# Patient Record
Sex: Female | Born: 1980 | Race: Black or African American | Hispanic: No | Marital: Married | Smoking: Never smoker
Health system: Southern US, Community
[De-identification: ages and names within clinical notes are randomized; demographics above are authoritative.]

## PROBLEM LIST (undated history)

## (undated) DIAGNOSIS — N760 Acute vaginitis: Secondary | ICD-10-CM

## (undated) DIAGNOSIS — J45909 Unspecified asthma, uncomplicated: Secondary | ICD-10-CM

## (undated) DIAGNOSIS — L680 Hirsutism: Secondary | ICD-10-CM

## (undated) DIAGNOSIS — B9689 Other specified bacterial agents as the cause of diseases classified elsewhere: Secondary | ICD-10-CM

## (undated) DIAGNOSIS — E282 Polycystic ovarian syndrome: Secondary | ICD-10-CM

## (undated) DIAGNOSIS — O24419 Gestational diabetes mellitus in pregnancy, unspecified control: Secondary | ICD-10-CM

## (undated) HISTORY — DX: Other specified bacterial agents as the cause of diseases classified elsewhere: N76.0

## (undated) HISTORY — DX: Other specified bacterial agents as the cause of diseases classified elsewhere: B96.89

## (undated) HISTORY — DX: Polycystic ovarian syndrome: E28.2

## (undated) HISTORY — DX: Unspecified asthma, uncomplicated: J45.909

## (undated) HISTORY — DX: Gestational diabetes mellitus in pregnancy, unspecified control: O24.419

## (undated) HISTORY — DX: Hirsutism: L68.0

---

## 2007-01-17 ENCOUNTER — Emergency Department (HOSPITAL_COMMUNITY): Admission: EM | Admit: 2007-01-17 | Discharge: 2007-01-17 | Payer: Self-pay | Admitting: Emergency Medicine

## 2008-02-21 ENCOUNTER — Emergency Department (HOSPITAL_COMMUNITY): Admission: EM | Admit: 2008-02-21 | Discharge: 2008-02-21 | Payer: Self-pay | Admitting: Emergency Medicine

## 2008-05-28 ENCOUNTER — Inpatient Hospital Stay (HOSPITAL_COMMUNITY): Admission: AD | Admit: 2008-05-28 | Discharge: 2008-05-28 | Payer: Self-pay | Admitting: Obstetrics & Gynecology

## 2008-05-28 ENCOUNTER — Ambulatory Visit: Payer: Self-pay | Admitting: Advanced Practice Midwife

## 2009-04-13 ENCOUNTER — Emergency Department (HOSPITAL_COMMUNITY): Admission: EM | Admit: 2009-04-13 | Discharge: 2009-04-13 | Payer: Self-pay | Admitting: Family Medicine

## 2010-04-15 LAB — POCT PREGNANCY, URINE: Preg Test, Ur: NEGATIVE

## 2010-04-15 LAB — POCT I-STAT, CHEM 8
BUN: 13 mg/dL (ref 6–23)
Calcium, Ion: 1.29 mmol/L (ref 1.12–1.32)
Chloride: 107 mEq/L (ref 96–112)
Creatinine, Ser: 0.8 mg/dL (ref 0.4–1.2)
Glucose, Bld: 85 mg/dL (ref 70–99)
HCT: 37 % (ref 36.0–46.0)
Hemoglobin: 12.6 g/dL (ref 12.0–15.0)
Potassium: 4 mEq/L (ref 3.5–5.1)
Sodium: 140 mEq/L (ref 135–145)
TCO2: 26 mmol/L (ref 0–100)

## 2010-04-15 LAB — POCT URINALYSIS DIP (DEVICE)
Bilirubin Urine: NEGATIVE
Glucose, UA: NEGATIVE mg/dL
Ketones, ur: NEGATIVE mg/dL
Nitrite: NEGATIVE
Protein, ur: NEGATIVE mg/dL
Specific Gravity, Urine: 1.02 (ref 1.005–1.030)
Urobilinogen, UA: 0.2 mg/dL (ref 0.0–1.0)
pH: 5.5 (ref 5.0–8.0)

## 2010-04-30 LAB — URINALYSIS, ROUTINE W REFLEX MICROSCOPIC
Bilirubin Urine: NEGATIVE
Glucose, UA: NEGATIVE mg/dL
Hgb urine dipstick: NEGATIVE
Ketones, ur: NEGATIVE mg/dL
Nitrite: NEGATIVE
Protein, ur: NEGATIVE mg/dL
Specific Gravity, Urine: 1.01 (ref 1.005–1.030)
Urobilinogen, UA: 0.2 mg/dL (ref 0.0–1.0)
pH: 7 (ref 5.0–8.0)

## 2010-04-30 LAB — CBC
HCT: 32.9 % — ABNORMAL LOW (ref 36.0–46.0)
Hemoglobin: 10.9 g/dL — ABNORMAL LOW (ref 12.0–15.0)
MCHC: 33.1 g/dL (ref 30.0–36.0)
MCV: 76.7 fL — ABNORMAL LOW (ref 78.0–100.0)
Platelets: 352 10*3/uL (ref 150–400)
RBC: 4.29 MIL/uL (ref 3.87–5.11)
RDW: 16 % — ABNORMAL HIGH (ref 11.5–15.5)
WBC: 5.8 10*3/uL (ref 4.0–10.5)

## 2010-04-30 LAB — URINE CULTURE: Colony Count: 4000

## 2010-04-30 LAB — GC/CHLAMYDIA PROBE AMP, GENITAL
Chlamydia, DNA Probe: NEGATIVE
GC Probe Amp, Genital: NEGATIVE

## 2010-04-30 LAB — WET PREP, GENITAL
Trich, Wet Prep: NONE SEEN
Yeast Wet Prep HPF POC: NONE SEEN

## 2012-02-10 ENCOUNTER — Encounter (HOSPITAL_COMMUNITY): Payer: Self-pay

## 2012-02-10 ENCOUNTER — Emergency Department (INDEPENDENT_AMBULATORY_CARE_PROVIDER_SITE_OTHER): Payer: BC Managed Care – PPO

## 2012-02-10 ENCOUNTER — Emergency Department (INDEPENDENT_AMBULATORY_CARE_PROVIDER_SITE_OTHER)
Admission: EM | Admit: 2012-02-10 | Discharge: 2012-02-10 | Disposition: A | Discharge status: Home / Self Care | Payer: BC Managed Care – PPO | Source: Home / Self Care | Attending: Emergency Medicine | Admitting: Emergency Medicine

## 2012-02-10 DIAGNOSIS — S82899A Other fracture of unspecified lower leg, initial encounter for closed fracture: Secondary | ICD-10-CM

## 2012-02-10 MED ORDER — HYDROCODONE-ACETAMINOPHEN 5-325 MG PO TABS: ORAL_TABLET | ORAL | Status: DC

## 2012-02-10 NOTE — ED Notes (Signed)
States she missed the last 3 steps when taking out trash, twisting right ankle ; c/o pain right ankle, worse when attempting weight bearing , ROM, or w palpation

## 2012-02-10 NOTE — ED Provider Notes (Signed)
Chief Complaint  Patient presents with  . Ankle Pain    History of Present Illness:   Donna Morrison is a 32 year old female who injured her right ankle, coming down some steps last night. She missed a step and fell. She twisted her right ankle. Did not hear a pop. Ever since then she's had pain and swelling beneath the lateral malleolus of the ankle and she's been unable to bear weight. She denies any numbness or tingling.  Review of Systems:  Other than noted above, the patient denies any of the following symptoms: Systemic:  No fevers, chills, sweats, or aches.   Musculoskeletal:  No joint pain, arthritis, bursitis, swelling, back pain, or neck pain. Neurological:  No muscular weakness or paresthesias.  PMFSH:  Past medical history, family history, social history, meds, and allergies were reviewed.  Physical Exam:   Vital signs:  BP 112/74  Pulse 74  Temp 98.2 F (36.8 C) (Oral)  Resp 16  Ht 5\' 4"  (1.626 m)  Wt 160 lb (72.576 kg)  BMI 27.46 kg/m2  SpO2 100%  LMP 01/14/2012 Gen:  Alert and oriented times 3.  In no distress. Musculoskeletal: Exam of the ankle reveals there is pain to palpation below the lateral malleolus, she has slight swelling, no bruising or deformity. Anterior drawer sign was negative.  Talar tilt was negative. Squeeze test was negative. Achilles tendon, peroneal tendon, and tibialis posterior were intact. Otherwise, all joints had a full a ROM with no swelling, bruising or deformity.  No edema, pulses full. Extremities were warm and pink.  Capillary refill was brisk.  Skin:  Clear, warm and dry.  No rash. Neuro:  Alert and oriented times 3.  Muscle strength was normal.  Sensation was intact to light touch.   Radiology:  Dg Ankle Complete Right  02/10/2012  *RADIOLOGY REPORT*  Clinical Data: Right ankle injury and pain.  RIGHT ANKLE - COMPLETE 3+ VIEW  Comparison: None  Findings: A tiny density along the lateral aspect of the calcaneus may represent a small  avulsion fracture. No other fracture, subluxation or dislocation identified. The ankle mortise is intact. No focal bony lesions are present.  IMPRESSION: Possible tiny avulsion fracture off of the calcaneal lateral process.   Original Report Authenticated By: Harmon Pier, M.D.    I reviewed the images independently and personally and concur with the radiologist's findings.  Course in Urgent Care Center:   She was given a Cam Walker boot and crutches for ambulation.  Assessment:  The encounter diagnosis was Ankle fracture.  Plan:   1.  The following meds were prescribed:   New Prescriptions   HYDROCODONE-ACETAMINOPHEN (NORCO/VICODIN) 5-325 MG PER TABLET    1 to 2 tabs every 4 to 6 hours as needed for pain.   2.  The patient was instructed in symptomatic care, including rest and activity, elevation, application of ice and compression.  Appropriate handouts were given. 3.  The patient was told to return if becoming worse in any way, if no better in 3 or 4 days, and given some red flag symptoms that would indicate earlier return.   4.  The patient was told to follow up with Dr. Jodi Geralds in one to 2 weeks.    Reuben Likes, MD 02/10/12 [name]

## 2012-10-15 ENCOUNTER — Encounter (HOSPITAL_COMMUNITY): Payer: Self-pay | Admitting: Emergency Medicine

## 2012-10-15 ENCOUNTER — Emergency Department (INDEPENDENT_AMBULATORY_CARE_PROVIDER_SITE_OTHER)
Admission: EM | Admit: 2012-10-15 | Discharge: 2012-10-15 | Disposition: A | Discharge status: Home / Self Care | Payer: Self-pay | Source: Home / Self Care | Attending: Emergency Medicine | Admitting: Emergency Medicine

## 2012-10-15 DIAGNOSIS — R1310 Dysphagia, unspecified: Secondary | ICD-10-CM

## 2012-10-15 DIAGNOSIS — R002 Palpitations: Secondary | ICD-10-CM

## 2012-10-15 NOTE — ED Provider Notes (Signed)
CSN: [id number]     Arrival date & time 10/15/12  1859 History   First MD Initiated Contact with Patient 10/15/12 2011     Chief Complaint  Patient presents with  . Chest Pain   (Consider location/radiation/quality/duration/timing/severity/associated sxs/prior Treatment) HPI Comments: Pt presents complaining of intermittent palpitations over the past 2 weeks, as well as a feeling of food getting stuck in her esophagus in her mid chest after eating that started 3 weeks ago and resolved completely 2 weeks ago. The palpitations only happened a couple of times, and resolved quickly. These happened after she woke up from a scary dream. She did not have any associated symptoms while she was having the palpitations including no dizziness, lightheadedness, chest pain, nausea, diaphoresis. The feeling of food getting stuck in her esophagus happened frequently for one week but then resolved completely. She has not experienced this at all in the past 2 weeks.   Patient is a 32 y.o. female presenting with chest pain.  Chest Pain Associated symptoms: dysphagia and palpitations   Associated symptoms: no abdominal pain, no cough, no dizziness, no fever, no headache, no nausea, no shortness of breath, not vomiting and no weakness     History reviewed. No pertinent past medical history. History reviewed. No pertinent past surgical history. No family history on file. History  Substance Use Topics  . Smoking status: Never Smoker   . Smokeless tobacco: Not on file  . Alcohol Use: No   OB History   Grav Para Term Preterm Abortions TAB SAB Ect Mult Living                 Review of Systems  Constitutional: Negative for fever and chills.  HENT: Positive for trouble swallowing.   Eyes: Negative for visual disturbance.  Respiratory: Negative for cough and shortness of breath.   Cardiovascular: Positive for palpitations. Negative for chest pain and leg swelling.  Gastrointestinal: Negative for nausea,  vomiting and abdominal pain.  Endocrine: Negative for polydipsia and polyuria.  Genitourinary: Negative for dysuria, urgency and frequency.  Musculoskeletal: Negative for myalgias and arthralgias.  Skin: Negative for rash.  Neurological: Negative for dizziness, weakness, light-headedness and headaches.    Allergies  Review of patient's allergies indicates no known allergies.  Home Medications   Current Outpatient Rx  Name  Route  Sig  Dispense  Refill  . HYDROcodone-acetaminophen (NORCO/VICODIN) 5-325 MG per tablet      1 to 2 tabs every 4 to 6 hours as needed for pain.   20 tablet   0    BP 119/79  Pulse 68  Temp(Src) 98.2 F (36.8 C) (Oral)  Resp 14  SpO2 100%  LMP 09/30/2012 Physical Exam  Nursing note and vitals reviewed. Constitutional: She is oriented to person, place, and time. Vital signs are normal. She appears well-developed and well-nourished. No distress.  HENT:  Head: Normocephalic and atraumatic.  Cardiovascular: Normal rate, regular rhythm and normal heart sounds.  Exam reveals no gallop and no friction rub.   No murmur heard. Pulmonary/Chest: Effort normal and breath sounds normal. No respiratory distress. She has no wheezes. She has no rales.  Abdominal: Soft. There is no tenderness.  Neurological: She is alert and oriented to person, place, and time. She has normal strength. Coordination normal.  Skin: Skin is warm and dry. No rash noted. She is not diaphoretic.  Psychiatric: She has a normal mood and affect. Judgment normal.    ED Course  Procedures (including critical care time)  Labs Review Labs Reviewed - No data to display Imaging Review No results found.  MDM   1. Heart palpitations   2. Dysphagia    Physical exam is normal. EKG is normal. The palpitations were likely caused by stress, she will followup with her primary care if any of these symptoms return.  ER if persistent symptoms     Graylon Good, PA-C 10/16/12 [phone]

## 2012-10-15 NOTE — ED Notes (Signed)
Onset three weeks ago of episodes of feeling like food was lodging in esophagus when she would swallow.  Patient also relates feeling tightness in chest with these episodes.  Patient describes a "thumping" sensation in chest as well.  Currently is not having any pain.  Patient is talkative, laughing, frequently tapping on phone

## 2012-10-16 NOTE — ED Provider Notes (Signed)
Medical screening examination/treatment/procedure(s) were performed by non-physician practitioner and as supervising physician I was immediately available for consultation/collaboration.  Leslee Home, M.D.   Reuben Likes, MD 10/16/12 1044

## 2013-12-06 ENCOUNTER — Encounter (HOSPITAL_COMMUNITY): Payer: Self-pay | Admitting: *Deleted

## 2013-12-06 ENCOUNTER — Emergency Department (HOSPITAL_COMMUNITY)
Admission: EM | Admit: 2013-12-06 | Discharge: 2013-12-06 | Disposition: A | Discharge status: Home / Self Care | Payer: BC Managed Care – PPO | Attending: Emergency Medicine | Admitting: Emergency Medicine

## 2013-12-06 DIAGNOSIS — Z791 Long term (current) use of non-steroidal anti-inflammatories (NSAID): Secondary | ICD-10-CM | POA: Insufficient documentation

## 2013-12-06 DIAGNOSIS — M25461 Effusion, right knee: Secondary | ICD-10-CM | POA: Diagnosis not present

## 2013-12-06 DIAGNOSIS — G5602 Carpal tunnel syndrome, left upper limb: Secondary | ICD-10-CM | POA: Diagnosis not present

## 2013-12-06 DIAGNOSIS — R200 Anesthesia of skin: Secondary | ICD-10-CM | POA: Insufficient documentation

## 2013-12-06 DIAGNOSIS — M5412 Radiculopathy, cervical region: Secondary | ICD-10-CM

## 2013-12-06 DIAGNOSIS — M79602 Pain in left arm: Secondary | ICD-10-CM | POA: Diagnosis present

## 2013-12-06 DIAGNOSIS — Z79899 Other long term (current) drug therapy: Secondary | ICD-10-CM | POA: Diagnosis not present

## 2013-12-06 MED ORDER — NAPROXEN 250 MG PO TABS
500.0000 mg | ORAL_TABLET | Freq: Once | ORAL | Status: AC
  Administered 2013-12-06: 500 mg via ORAL
  Filled 2013-12-06: qty 2

## 2013-12-06 MED ORDER — METHOCARBAMOL 500 MG PO TABS: 500.0000 mg | ORAL_TABLET | Freq: Two times a day (BID) | ORAL | Status: DC

## 2013-12-06 MED ORDER — NAPROXEN 500 MG PO TABS: 500.0000 mg | ORAL_TABLET | Freq: Two times a day (BID) | ORAL | Status: DC

## 2013-12-06 NOTE — ED Notes (Addendum)
Left hand tingling -  2 hrs. Later - posterior neck pain that radiated down left hand. Chronic rt. Knee pain and swollen. Pt. States, "same issue in [place]; told that she had a stroke and never admitted; got a pain shot and sent home."

## 2013-12-06 NOTE — ED Notes (Signed)
Declined W/C at D/C and was escorted to lobby by RN. 

## 2013-12-06 NOTE — ED Provider Notes (Signed)
CSN: [id number]     Arrival date & time 12/06/13  1257 History  This chart was scribed for non-physician practitioner, Dierdre Forth, PA-C working with Donna Lennert, MD by Greggory Stallion, ED scribe. This patient was seen in room TR10C/TR10C and the patient's care was started at 3:02 PM.   Chief Complaint  Patient presents with  . Shoulder Pain   The history is provided by the patient. No language interpreter was used.    HPI Comments: Donna Morrison is a 33 y.o. female who presents to the Emergency Department complaining of resolved left arm and hand numbness and tingling that started this morning. States it stopped on the way here. Reports left arm pain and left sided neck pain that started once the tingling stopped but is now resolved. Pt states this has happened in the past but it was worse than before. The symptoms are worse in the mornings and states she will wake up in the middle of the night with the tingling in her left arm and hand. Pt has to type and use the phone a lot at work and often this creates the tingling in her hand.  She denies weakness in the LUE. She is also complaining of right knee pain with associated swelling. States she does not work out like she used to. NO hx of trauma to the knee.  Pt has iced with no relief. She does not have a PCP.   History reviewed. No pertinent past medical history. History reviewed. No pertinent past surgical history. History reviewed. No pertinent family history. History  Substance Use Topics  . Smoking status: Never Smoker   . Smokeless tobacco: Not on file  . Alcohol Use: No   OB History    No data available     Review of Systems  Constitutional: Negative for fever, chills and fatigue.  Respiratory: Negative for chest tightness and shortness of breath.   Cardiovascular: Negative for chest pain.  Gastrointestinal: Negative for nausea, vomiting, abdominal pain and diarrhea.  Genitourinary: Negative for dysuria, urgency,  frequency and hematuria.  Musculoskeletal: Positive for arthralgias (left shoulder) and neck pain (resolved). Negative for back pain, gait problem and neck stiffness.  Skin: Negative for rash and wound.  Neurological: Negative for weakness, light-headedness, numbness and headaches.  Hematological: Does not bruise/bleed easily.  Psychiatric/Behavioral: The patient is not nervous/anxious.   All other systems reviewed and are negative.  Allergies  Review of patient's allergies indicates no known allergies.  Home Medications   Prior to Admission medications   Medication Sig Start Date End Date Taking? Authorizing Provider  HYDROcodone-acetaminophen (NORCO/VICODIN) 5-325 MG per tablet 1 to 2 tabs every 4 to 6 hours as needed for pain. 02/10/12   Reuben Likes, MD  methocarbamol (ROBAXIN) 500 MG tablet Take 1 tablet (500 mg total) by mouth 2 (two) times daily. 12/06/13   Trystin Terhune, PA-C  naproxen (NAPROSYN) 500 MG tablet Take 1 tablet (500 mg total) by mouth 2 (two) times daily with a meal. 12/06/13   Freddi Schrager, PA-C   BP 134/72 mmHg  Pulse 63  Temp(Src) 97.7 F (36.5 C) (Oral)  Resp 20  SpO2 100%  LMP  (LMP Unknown)   Physical Exam  Constitutional: She appears well-developed and well-nourished. No distress.  HENT:  Head: Normocephalic and atraumatic.  Mouth/Throat: Oropharynx is clear and moist. No oropharyngeal exudate.  Eyes: Conjunctivae are normal.  Neck: Normal range of motion. Neck supple.  Full ROM without pain  Cardiovascular: Normal  rate, regular rhythm and intact distal pulses.   Pulmonary/Chest: Effort normal and breath sounds normal. No respiratory distress. She has no wheezes.  Abdominal: Soft. She exhibits no distension. There is no tenderness.  Musculoskeletal:  Full range of motion of the T-spine and L-spine No tenderness to palpation of the spinous processes of the T-spine or L-spine No tenderness to palpation of the paraspinous muscles of the  L-spine Tenderness to palpation of left trapezius  Full ROM of the left shoulder, elbow, wrist and fingers Right knee with moderate joint effusion without erythema, increased warmth or significant swelling Full ROM with pain No pain to the popliteal fossa   Positive Phalen and Tinel's in the left hand  Lymphadenopathy:    She has no cervical adenopathy.  Neurological: She is alert. She has normal reflexes.  Reflex Scores:      Bicep reflexes are 2+ on the right side and 2+ on the left side.      Brachioradialis reflexes are 2+ on the right side and 2+ on the left side.      Patellar reflexes are 2+ on the right side and 2+ on the left side.      Achilles reflexes are 2+ on the right side and 2+ on the left side. Speech is clear and goal oriented, follows commands Normal 5/5 strength in upper and lower extremities bilaterally including dorsiflexion and plantar flexion, strong and equal grip strength Sensation normal to light and sharp touch Moves extremities without ataxia, coordination intact Normal gait Normal balance No Clonus   Skin: Skin is warm and dry. No rash noted. She is not diaphoretic. No erythema.  Psychiatric: She has a normal mood and affect. Her behavior is normal.  Nursing note and vitals reviewed.   ED Course  Procedures (including critical care time)  DIAGNOSTIC STUDIES: Oxygen Saturation is 100% on RA, normal by my interpretation.    COORDINATION OF CARE: 3:10 PM-Discussed treatment plan which includes knee xray, wrist brace, knee brace, and an anti-inflammatory with pt at bedside and pt agreed to plan. Will give pt orthopedic and PCP referrals and advised her to follow up.    Labs Review Labs Reviewed - No data to display  Imaging Review No results found.   EKG Interpretation None      MDM   Final diagnoses:  Cervical radiculopathy, acute  Carpal tunnel syndrome on left  Knee effusion, right   Donna Morrison presents with symptoms  clinically consistent with cervical radiculopathy. She complained of neck pain that is radiated down arm. There has been no recent trauma and imaging is not indicated at this time. Patient has not been previously evaluated, will begin naproxen and Robaxin.  No evidence of cervical nerve root compression on physical exam. DC w conservative home therapies (ie heat).  Patient also with signs and symptoms clinically consistent with carpal tunnel of the left hand. It will give up Velcro brace to sleep in.  Advised f-u w PCP or orthopedics if s/s persist.   Pt with mild swelling to the joint spaces, knee swelling, tightness in the knee, and without restricted range of motion.  Pt is without systemic symptoms, erythema or redness of the joint consistent with gout or septic joint.  Patient declines x-rays and patient has been without trauma to the knee. Pain managed in ED. Patient given brace while in ED, conservative therapy recommended and discussed. Patient will be dc home & is agreeable with above plan.  I have personally reviewed  patient's vitals, nursing note and any pertinent labs or imaging.  I performed an focused physical exam; undressed when appropriate .    It has been determined that no acute conditions requiring further emergency intervention are present at this time. The patient/guardian have been advised of the diagnosis and plan. I reviewed any labs and imaging including any potential incidental findings. We have discussed signs and symptoms that warrant return to the ED and they are listed in the discharge instructions.    Vital signs are stable at discharge.   BP 134/72 mmHg  Pulse 63  Temp(Src) 97.7 F (36.5 C) (Oral)  Resp 20  SpO2 100%  LMP  (LMP Unknown)  I personally performed the services described in this documentation, which was scribed in my presence. The recorded information has been reviewed and is accurate.  Dahlia ClientHannah Koree Staheli, PA-C 12/06/13 1543  Donna LennertJoseph L Zammit,  MD 12/06/13 [phone]

## 2013-12-06 NOTE — Discharge Instructions (Signed)
1. Medications: Robaxin, Naprosyn, usual home medications 2. Treatment: rest, drink plenty of fluids, wear brace on her knee and wrist 3. Follow Up: Please followup with your primary doctor in one week for discussion of your diagnoses and further evaluation after today's visit; if you do not have a primary care doctor use the resource guide provided to find one; Please return to the ER for worsening symptoms, persistent symptoms or other concerns   Carpal Tunnel Syndrome The carpal tunnel is a narrow area located on the palm side of your wrist. The tunnel is formed by the wrist bones and ligaments. Nerves, blood vessels, and tendons pass through the carpal tunnel. Repeated wrist motion or certain diseases may cause swelling within the tunnel. This swelling pinches the main nerve in the wrist (median nerve) and causes the painful hand and arm condition called carpal tunnel syndrome. CAUSES   Repeated wrist motions.  Wrist injuries.  Certain diseases like arthritis, diabetes, alcoholism, hyperthyroidism, and kidney failure.  Obesity.  Pregnancy. SYMPTOMS   A "pins and needles" feeling in your fingers or hand, especially in your thumb, index and middle fingers.  Tingling or numbness in your fingers or hand.  An aching feeling in your entire arm, especially when your wrist and elbow are bent for long periods of time.  Wrist pain that goes up your arm to your shoulder.  Pain that goes down into your palm or fingers.  A weak feeling in your hands. DIAGNOSIS  Your health care provider will take your history and perform a physical exam. An electromyography test may be needed. This test measures electrical signals sent out by your nerves into the muscles. The electrical signals are usually slowed by carpal tunnel syndrome. You may also need X-rays. TREATMENT  Carpal tunnel syndrome may clear up by itself. Your health care provider may recommend a wrist splint or medicine such as a  nonsteroidal anti-inflammatory medicine. Cortisone injections may help. Sometimes, surgery may be needed to free the pinched nerve.  HOME CARE INSTRUCTIONS   Take all medicine as directed by your health care provider. Only take over-the-counter or prescription medicines for pain, discomfort, or fever as directed by your health care provider.  If you were given a splint to keep your wrist from bending, wear it as directed. It is important to wear the splint at night. Wear the splint for as long as you have pain or numbness in your hand, arm, or wrist. This may take 1 to 2 months.  Rest your wrist from any activity that may be causing your pain. If your symptoms are work-related, you may need to talk to your employer about changing to a job that does not require using your wrist.  Put ice on your wrist after long periods of wrist activity.  Put ice in a plastic bag.  Place a towel between your skin and the bag.  Leave the ice on for 15-20 minutes, 03-04 times a day.  Keep all follow-up visits as directed by your health care provider. This includes any orthopedic referrals, physical therapy, and rehabilitation. Any delay in getting necessary care could result in a delay or failure of your condition to heal. SEEK IMMEDIATE MEDICAL CARE IF:   You have new, unexplained symptoms.  Your symptoms get worse and are not helped or controlled with medicines. MAKE SURE YOU:   Understand these instructions.  Will watch your condition.  Will get help right away if you are not doing well or get worse. Document  Released: 01/04/2000 Document Revised: 05/23/2013 Document Reviewed: 11/22/2010 [facility] Patient Information 2015 [place], [place]. This information is not intended to replace advice given to you by your health care provider. Make sure you discuss any questions you have with your health care provider.  Cervical Radiculopathy Cervical radiculopathy happens when a nerve in the neck is pinched or  bruised by a slipped (herniated) disk or by arthritic changes in the bones of the cervical spine. This can occur due to an injury or as part of the normal aging process. Pressure on the cervical nerves can cause pain or numbness that runs from your neck all the way down into your arm and fingers. CAUSES  There are many possible causes, including:  Injury.  Muscle tightness in the neck from overuse.  Swollen, painful joints (arthritis).  Breakdown or degeneration in the bones and joints of the spine (spondylosis) due to aging.  Bone spurs that may develop near the cervical nerves. SYMPTOMS  Symptoms include pain, weakness, or numbness in the affected arm and hand. Pain can be severe or irritating. Symptoms may be worse when extending or turning the neck. DIAGNOSIS  Your caregiver will ask about your symptoms and do a physical exam. He or she may test your strength and reflexes. X-rays, CT scans, and MRI scans may be needed in cases of injury or if the symptoms do not go away after a period of time. Electromyography (EMG) or nerve conduction testing may be done to study how your nerves and muscles are working. TREATMENT  Your caregiver may recommend certain exercises to help relieve your symptoms. Cervical radiculopathy can, and often does, get better with time and treatment. If your problems continue, treatment options may include:  Wearing a soft collar for short periods of time.  Physical therapy to strengthen the neck muscles.  Medicines, such as nonsteroidal anti-inflammatory drugs (NSAIDs), oral corticosteroids, or spinal injections.  Surgery. Different types of surgery may be done depending on the cause of your problems. HOME CARE INSTRUCTIONS   Put ice on the affected area.  Put ice in a plastic bag.  Place a towel between your skin and the bag.  Leave the ice on for 15-20 minutes, 03-04 times a day or as directed by your caregiver.  If ice does not help, you can try using  heat. Take a warm shower or bath, or use a hot water bottle as directed by your caregiver.  You may try a gentle neck and shoulder massage.  Use a flat pillow when you sleep.  Only take over-the-counter or prescription medicines for pain, discomfort, or fever as directed by your caregiver.  If physical therapy was prescribed, follow your caregiver's directions.  If a soft collar was prescribed, use it as directed. SEEK IMMEDIATE MEDICAL CARE IF:   Your pain gets much worse and cannot be controlled with medicines.  You have weakness or numbness in your hand, arm, face, or leg.  You have a high fever or a stiff, rigid neck.  You lose bowel or bladder control (incontinence).  You have trouble with walking, balance, or speaking. MAKE SURE YOU:   Understand these instructions.  Will watch your condition.  Will get help right away if you are not doing well or get worse. Document Released: 10/01/2000 Document Revised: 03/31/2011 Document Reviewed: 08/20/2010 [facility] Patient Information 2015 [place], [place]. This information is not intended to replace advice given to you by your health care provider. Make sure you discuss any questions you have with your  health care provider.   Knee Effusion The medical term for having fluid in your knee is effusion. This is often due to an internal derangement of the knee. This means something is wrong inside the knee. Some of the causes of fluid in the knee may be torn cartilage, a torn ligament, or bleeding into the joint from an injury. Your knee is likely more difficult to bend and move. This is often because there is increased pain and pressure in the joint. The time it takes for recovery from a knee effusion depends on different factors, including:   Type of injury.  Your age.  Physical and medical conditions.  Rehabilitation Strategies. How long you will be away from your normal activities will depend on what kind of knee problem you have  and how much damage is present. Your knee has two types of cartilage. Articular cartilage covers the bone ends and lets your knee bend and move smoothly. Two menisci, thick pads of cartilage that form a rim inside the joint, help absorb shock and stabilize your knee. Ligaments bind the bones together and support your knee joint. Muscles move the joint, help support your knee, and take stress off the joint itself. CAUSES  Often an effusion in the knee is caused by an injury to one of the menisci. This is often a tear in the cartilage. Recovery after a meniscus injury depends on how much meniscus is damaged and whether you have damaged other knee tissue. Small tears may heal on their own with conservative treatment. Conservative means rest, limited weight bearing activity and muscle strengthening exercises. Your recovery may take up to 6 weeks.  TREATMENT  Larger tears may require surgery. Meniscus injuries may be treated during arthroscopy. Arthroscopy is a procedure in which your surgeon uses a small telescope like instrument to look in your knee. Your caregiver can make a more accurate diagnosis (learning what is wrong) by performing an arthroscopic procedure. If your injury is on the inner margin of the meniscus, your surgeon may trim the meniscus back to a smooth rim. In other cases your surgeon will try to repair a damaged meniscus with stitches (sutures). This may make rehabilitation take longer, but may provide better long term result by helping your knee keep its shock absorption capabilities. Ligaments which are completely torn usually require surgery for repair. HOME CARE INSTRUCTIONS  Use crutches as instructed.  If a brace is applied, use as directed.  Once you are home, an ice pack applied to your swollen knee may help with discomfort and help decrease swelling.  Keep your knee raised (elevated) when you are not up and around or on crutches.  Only take over-the-counter or prescription  medicines for pain, discomfort, or fever as directed by your caregiver.  Your caregivers will help with instructions for rehabilitation of your knee. This often includes strengthening exercises.  You may resume a normal diet and activities as directed. SEEK MEDICAL CARE IF:   There is increased swelling in your knee.  You notice redness, swelling, or increasing pain in your knee.  An unexplained oral temperature above 102 F (38.9 C) develops. SEEK IMMEDIATE MEDICAL CARE IF:   You develop a rash.  You have difficulty breathing.  You have any allergic reactions from medications you may have been given.  There is severe pain with any motion of the knee. MAKE SURE YOU:   Understand these instructions.  Will watch your condition.  Will get help right away if you are  not doing well or get worse. Document Released: 03/29/2003 Document Revised: 03/31/2011 Document Reviewed: 06/02/2007 [facility] Patient Information 2015 [place], [place]. This information is not intended to replace advice given to you by your health care provider. Make sure you discuss any questions you have with your health care provider.   Emergency Department Resource Guide 1) Find a Doctor and Pay Out of Pocket Although you won't have to find out who is covered by your insurance plan, it is a good idea to ask around and get recommendations. You will then need to call the office and see if the doctor you have chosen will accept you as a new patient and what types of options they offer for patients who are self-pay. Some doctors offer discounts or will set up payment plans for their patients who do not have insurance, but you will need to ask so you aren't surprised when you get to your appointment.  2) Contact Your Local Health Department Not all health departments have doctors that can see patients for sick visits, but many do, so it is worth a call to see if yours does. If you don't know where your local health  department is, you can check in your phone book. The CDC also has a tool to help you locate your state's health department, and many state websites also have listings of all of their local health departments.  3) Find a Walk-in Clinic If your illness is not likely to be very severe or complicated, you may want to try a walk in clinic. These are popping up all over the country in pharmacies, drugstores, and shopping centers. They're usually staffed by nurse practitioners or physician assistants that have been trained to treat common illnesses and complaints. They're usually fairly quick and inexpensive. However, if you have serious medical issues or chronic medical problems, these are probably not your best option.  No Primary Care Doctor: - Call Health Connect at  [phone] - they can help you locate a primary care doctor that  accepts your insurance, provides certain services, etc. - Physician Referral Service- [phone]  Chronic Pain Problems: Organization         Address  Phone   Notes  [facility]  [phone] Patients need to be referred by their primary care doctor.   Medication Assistance: Organization         Address  Phone   Notes  [facility] Medication [facility] [address] [place] [place], [place] [phone] --Must be a resident of [facility] -- Must have NO insurance coverage whatsoever (no Medicaid/ Medicare, etc.) -- The pt. MUST have a primary care doctor that directs their care regularly and follows them in the community   [facility]ist  [phone]   [employer]  [phone]    Agencies that provide inexpensive medical care: Organization         Address  Phone   Notes  [facility]  [phone]   [facility]    [phone]   [facility] [address] [place], [place] [address] [phone]   [facility] [address] [place], [place] [phone]   Planned Parenthood    [phone]   [facility]    [phone]   [facility]  [address], [place] Phone:  [phone], Fax:  [phone] Hours of Operation:  9 am - 6 pm, M-F.  Also accepts Medicaid/Medicare and self-pay.  [facility]  [address], [place] Phone: [phone], Fax: [phone]. Hours of Operation:  8:30 am - 5:30 pm, M-F.  Also accepts Medicaid and self-pay.  [facility] [place] [address] [place] Phone: [phone]   [facility] [address] Natasha Bence [place], [place] [phone], Ext. [phone] Mondays & Thursdays: 7-9 AM.  First 15 patients are seen on a first come, first serve basis.    Medicaid-accepting [facility] Providers:  Organization         Address  Phone   Notes  [facility] [address], [place] [phone] Also accepts self-pay patients.  [facility] [address] Laurell Josephs [place], [place]  [phone]   [facility] [address], [place] [phone]   [facility] [address], [place] [phone]   Renaye Rakers [address], [place]   [phone] Only accepts [place] patients after they have their name applied to their card.   Self-Pay (no insurance) in [facility]:  Organization         Address  Phone   Notes  Sickle Cell Patients, [facility] Internal Medicine [address] [place], [place] [phone]   [facility] Urgent Care [address] [place], [place] [phone]   [facility]  [address], [place] [phone]   [facility]/Dr. Osei-Bonsu  [address] [place] or [address], [place] [phone] Phone number for both [place] and  [place] locations is the same.  [facility] [address] [place] [phone]   [facility] [address], [place] or [address] [phone]   [facility] [address], [place] [phone], phone; [phone], fax Sees patients 1st and 3rd Saturday of every month.  Must not qualify for public or private insurance (i.e. Medicaid, Medicare, [place] Health Choice, Veterans' Benefits)  Household income should be no more than 200% of the poverty level The clinic cannot treat you if you are pregnant or think you are pregnant  Sexually transmitted diseases are not treated at the clinic.    Dental Care: Organization         Address  Phone  Notes  [facility] of [facility] [address] [place], [place] [phone] Accepts children up to age 43 who are enrolled in [place] or [place] Health Choice; pregnant women with a Medicaid card; and children who have applied for Medicaid or [place] Health Choice, but were declined, whose parents can pay a reduced fee at time of service.  [facility] of [facility]  [address], [place] [phone] Accepts children up to age 28 who are enrolled in [place] or [place] Health Choice; pregnant women with a Medicaid card; and children who have applied for Medicaid or [place] Health Choice, but were declined, whose parents can pay a reduced fee at time of service.  [facility]  [address] [place], [place] [phone] Patients are seen by appointment only. Walk-ins are not accepted. [facility] will see patients 18 years of  age and older. Monday - Tuesday (8am-5pm) Most Wednesdays (8:30-5pm) $30 per visit, cash only  [facility]  [address], [facility] [phone] Patients are seen by appointment only. Walk-ins are not accepted.  [facility] will see patients 33 years of age and older. One Wednesday Evening (Monthly: Volunteer Based).  $30 per visit, cash only  [employer]  [phone] for adults; Children under age 25, call Graduate Pediatric Dentistry at [phone]. Children aged 72-14, please call [phone] to request a pediatric application.  Dental services are provided in all areas of dental care including fillings, crowns and bridges, complete and partial dentures, implants, gum treatment, root canals, and extractions. Preventive care is also provided. Treatment is provided to both adults and children. Patients are selected via a lottery and there is often a waiting list.   [facility] [address] [place]  [phone] [email or web address]   [facility] [address] [place], [place] [phone], Ext. [phone] Second and Fourth Thursday of each month, opens at 6:30 AM; Clinic ends at 9 AM.  Patients are seen on a first-come first-served basis, and a limited number are seen during each clinic.   [facility]  [address] Ether Griffins [place], [place] [phone]   Eligibility Requirements You must have lived in [place], [place], or [place] counties for at least the last three months.   You cannot be eligible for state or federal sponsored [place], including [employer], [place], or [employer].   You generally cannot be eligible for healthcare insurance through your employer.    How to apply: Eligibility screenings are held every Tuesday and Wednesday afternoon from 1:00 pm until 4:00 pm. You do not need an appointment for the interview!  [facility] [address] [place], [place] [phone]   [facility] Health Department  [phone]   [facility] Health Department  [phone]   [facility] Health Department  [phone]    Behavioral Health Resources in the  Community: Intensive Outpatient Programs Organization         Address  Phone  Notes  [facility] [address] [place], [place] [phone]   [facility] [address] [place], [place] [phone]   [facility] [address], [place], [place]  [phone]   [facility] [address],  [place], [place] [phone] or [phone]   Substance Abuse Resources Organization         Address  Phone  Notes  [facility]  [phone]   [facility]  [phone]   [facility] [place]  [phone]   Floydene Flock  [phone]   Residential & Outpatient Substance Abuse Program  [phone]   Psychological Services Organization         Address  Phone  Notes  [facility]  [phone]   [facility]  [phone]   [facility] Mental Health [address] [place] [phone] or [phone]    Mobile Crisis Teams Organization         Address  Phone  Notes  [facility], Mobile Crisis Care Unit  [phone]   [facility]  [address] [place], [place] [phone]   [facility] [address] [place] [phone]    Self-Help/Support Groups Organization  Address  Phone             Notes  [facility] [place] - variety of support groups  336- [phone] Call for more information  Narcotics Anonymous (NA), [facility] [address], [place]  2 meetings at this location   [facility]istician[facility]idential Treatment Programs Organization         Address  Phone  Notes  [facility] [address],    [place]  [phone]   [facility]  [address], [place] [phone], [place], [place] [phone]   [facility] [address], [place] [phone] Admissions: 8am-3pm M-F  [facility] [address]    [place], [place] [phone]   [facility] [address], [place], [place] [phone]   [facility] [address]  [place], [place] [phone]   [facility] - Intensive Outpatient [address], [place], [place] [phone]   [facility] ([facility] [address]  [place], [place] [phone] or [phone]   [facility] ([facility]) [address] [place], [place] [phone] Accepts Medicaid  [facility] [address]  [place] [phone] Substance Abuse/Addiction Treatment   [facility] Behavioral Health Resources Organization         Address  Phone  Notes  [facility]  [phone]   Angie FavaJulie Brannon, PhD [address] [place], [place]   [phone]) [phone] or [phone]   [facility]   [address] [place], [place] [phone]   [facility] [address], [place], [place] [phone](336) [phone] Insurance/Medicaid/sponsorship through [facility] [facility] [address]                                    [place], [place] [phone](336) [phone] Therapy/tele-psych/case  [facility] [address]   [place], [place] [phone]) [phone]    Dr. Lolly MustacheArfeen  [phone]   [facility]  [employer] [facility] 1) [address], [place] 2) [address], [place] 3)  [address], [place] [phone]  [phone]) [phone]   [facility] Child [facility] Hotline [phone](336) [phone] or [phone]) [phone] (After Hours)

## 2015-05-22 DIAGNOSIS — Z01419 Encounter for gynecological examination (general) (routine) without abnormal findings: Secondary | ICD-10-CM | POA: Diagnosis not present

## 2015-05-22 DIAGNOSIS — N979 Female infertility, unspecified: Secondary | ICD-10-CM | POA: Diagnosis not present

## 2015-05-22 DIAGNOSIS — Z139 Encounter for screening, unspecified: Secondary | ICD-10-CM | POA: Diagnosis not present

## 2015-05-22 DIAGNOSIS — D473 Essential (hemorrhagic) thrombocythemia: Secondary | ICD-10-CM | POA: Diagnosis not present

## 2015-05-22 DIAGNOSIS — Z124 Encounter for screening for malignant neoplasm of cervix: Secondary | ICD-10-CM | POA: Diagnosis not present

## 2015-05-22 DIAGNOSIS — Z6831 Body mass index (BMI) 31.0-31.9, adult: Secondary | ICD-10-CM | POA: Diagnosis not present

## 2015-05-22 DIAGNOSIS — N926 Irregular menstruation, unspecified: Secondary | ICD-10-CM | POA: Diagnosis not present

## 2015-05-22 DIAGNOSIS — N760 Acute vaginitis: Secondary | ICD-10-CM | POA: Diagnosis not present

## 2015-06-11 ENCOUNTER — Other Ambulatory Visit (HOSPITAL_COMMUNITY): Payer: Self-pay | Admitting: Obstetrics and Gynecology

## 2015-06-11 DIAGNOSIS — N979 Female infertility, unspecified: Secondary | ICD-10-CM

## 2015-06-15 ENCOUNTER — Ambulatory Visit (HOSPITAL_COMMUNITY)
Admission: RE | Admit: 2015-06-15 | Discharge: 2015-06-15 | Disposition: A | Discharge status: Home / Self Care | Payer: BLUE CROSS/BLUE SHIELD | Source: Ambulatory Visit | Attending: Obstetrics and Gynecology | Admitting: Obstetrics and Gynecology

## 2015-06-15 DIAGNOSIS — N979 Female infertility, unspecified: Secondary | ICD-10-CM

## 2015-06-15 MED ORDER — IOPAMIDOL (ISOVUE-300) INJECTION 61%
30.0000 mL | Freq: Once | INTRAVENOUS | Status: AC | PRN
  Administered 2015-06-15: 10 mL

## 2016-09-04 ENCOUNTER — Other Ambulatory Visit (HOSPITAL_COMMUNITY): Payer: Self-pay | Admitting: Obstetrics & Gynecology

## 2016-09-04 DIAGNOSIS — Z6832 Body mass index (BMI) 32.0-32.9, adult: Secondary | ICD-10-CM | POA: Diagnosis not present

## 2016-09-04 DIAGNOSIS — N970 Female infertility associated with anovulation: Secondary | ICD-10-CM | POA: Diagnosis not present

## 2016-09-04 DIAGNOSIS — N912 Amenorrhea, unspecified: Secondary | ICD-10-CM | POA: Diagnosis not present

## 2016-10-08 DIAGNOSIS — N970 Female infertility associated with anovulation: Secondary | ICD-10-CM | POA: Diagnosis not present

## 2016-11-10 DIAGNOSIS — N970 Female infertility associated with anovulation: Secondary | ICD-10-CM | POA: Diagnosis not present

## 2016-12-10 DIAGNOSIS — N970 Female infertility associated with anovulation: Secondary | ICD-10-CM | POA: Diagnosis not present

## 2016-12-19 DIAGNOSIS — N970 Female infertility associated with anovulation: Secondary | ICD-10-CM | POA: Diagnosis not present

## 2017-01-16 DIAGNOSIS — N970 Female infertility associated with anovulation: Secondary | ICD-10-CM | POA: Diagnosis not present

## 2017-02-09 DIAGNOSIS — N979 Female infertility, unspecified: Secondary | ICD-10-CM | POA: Diagnosis not present

## 2017-03-10 DIAGNOSIS — N970 Female infertility associated with anovulation: Secondary | ICD-10-CM | POA: Diagnosis not present

## 2017-03-11 DIAGNOSIS — Z3189 Encounter for other procreative management: Secondary | ICD-10-CM | POA: Diagnosis not present

## 2017-05-19 DIAGNOSIS — N912 Amenorrhea, unspecified: Secondary | ICD-10-CM | POA: Diagnosis not present

## 2017-07-08 DIAGNOSIS — R42 Dizziness and giddiness: Secondary | ICD-10-CM | POA: Diagnosis not present

## 2017-07-08 DIAGNOSIS — H9202 Otalgia, left ear: Secondary | ICD-10-CM | POA: Diagnosis not present

## 2017-08-18 DIAGNOSIS — N970 Female infertility associated with anovulation: Secondary | ICD-10-CM | POA: Diagnosis not present

## 2017-11-10 DIAGNOSIS — Z319 Encounter for procreative management, unspecified: Secondary | ICD-10-CM | POA: Diagnosis not present

## 2017-11-10 DIAGNOSIS — Z3181 Encounter for male factor infertility in female patient: Secondary | ICD-10-CM | POA: Diagnosis not present

## 2017-11-10 DIAGNOSIS — Z3169 Encounter for other general counseling and advice on procreation: Secondary | ICD-10-CM | POA: Diagnosis not present

## 2017-11-10 DIAGNOSIS — E282 Polycystic ovarian syndrome: Secondary | ICD-10-CM | POA: Diagnosis not present

## 2017-11-17 DIAGNOSIS — Z3200 Encounter for pregnancy test, result unknown: Secondary | ICD-10-CM | POA: Diagnosis not present

## 2017-11-25 DIAGNOSIS — Z3200 Encounter for pregnancy test, result unknown: Secondary | ICD-10-CM | POA: Diagnosis not present

## 2017-12-10 DIAGNOSIS — O0900 Supervision of pregnancy with history of infertility, unspecified trimester: Secondary | ICD-10-CM | POA: Diagnosis not present

## 2017-12-30 DIAGNOSIS — Z118 Encounter for screening for other infectious and parasitic diseases: Secondary | ICD-10-CM | POA: Diagnosis not present

## 2017-12-30 DIAGNOSIS — Z124 Encounter for screening for malignant neoplasm of cervix: Secondary | ICD-10-CM | POA: Diagnosis not present

## 2017-12-30 DIAGNOSIS — O09511 Supervision of elderly primigravida, first trimester: Secondary | ICD-10-CM | POA: Diagnosis not present

## 2017-12-30 DIAGNOSIS — Z3A11 11 weeks gestation of pregnancy: Secondary | ICD-10-CM | POA: Diagnosis not present

## 2017-12-30 DIAGNOSIS — Z113 Encounter for screening for infections with a predominantly sexual mode of transmission: Secondary | ICD-10-CM | POA: Diagnosis not present

## 2017-12-30 LAB — OB RESULTS CONSOLE GC/CHLAMYDIA
Chlamydia: NEGATIVE
Gonorrhea: NEGATIVE

## 2017-12-30 LAB — OB RESULTS CONSOLE ANTIBODY SCREEN: Antibody Screen: NEGATIVE

## 2017-12-30 LAB — OB RESULTS CONSOLE HEPATITIS B SURFACE ANTIGEN: Hepatitis B Surface Ag: NEGATIVE

## 2017-12-30 LAB — OB RESULTS CONSOLE ABO/RH: RH Type: POSITIVE

## 2017-12-30 LAB — OB RESULTS CONSOLE HIV ANTIBODY (ROUTINE TESTING): HIV: NONREACTIVE

## 2017-12-30 LAB — OB RESULTS CONSOLE RUBELLA ANTIBODY, IGM: Rubella: IMMUNE

## 2018-01-06 DIAGNOSIS — N760 Acute vaginitis: Secondary | ICD-10-CM | POA: Diagnosis not present

## 2018-01-06 DIAGNOSIS — R300 Dysuria: Secondary | ICD-10-CM | POA: Diagnosis not present

## 2018-01-11 DIAGNOSIS — Z3A12 12 weeks gestation of pregnancy: Secondary | ICD-10-CM | POA: Diagnosis not present

## 2018-01-11 DIAGNOSIS — O09511 Supervision of elderly primigravida, first trimester: Secondary | ICD-10-CM | POA: Diagnosis not present

## 2018-01-20 NOTE — L&D Delivery Note (Addendum)
Delivery Note At 1:09 PM a viable and healthy female was delivered via Vaginal, Spontaneous (Presentation: ;ROA  ).  APGAR: 3, 9; -- NICU team called for code Apgar Weight -pending Placenta status: spontaneous and complete.  Cord:  with the following complications: Tight nuchal cord, clamped and cut after head emerged partially.  Cord pH (art) 7.20  Anesthesia:  epidural Episiotomy: Left Mediolateral- 2nd degree Suture Repair: 3.0 vicryl rapide Est. Blood Loss (mL):  1007 cc   Mom to postpartum.  Baby to Couplet care / Skin to Skin.  Elveria Royals 07/23/2018, 2:02 PM

## 2018-01-28 DIAGNOSIS — Z361 Encounter for antenatal screening for raised alphafetoprotein level: Secondary | ICD-10-CM | POA: Diagnosis not present

## 2018-01-28 DIAGNOSIS — O09512 Supervision of elderly primigravida, second trimester: Secondary | ICD-10-CM | POA: Diagnosis not present

## 2018-01-28 DIAGNOSIS — Z3A15 15 weeks gestation of pregnancy: Secondary | ICD-10-CM | POA: Diagnosis not present

## 2018-02-03 ENCOUNTER — Ambulatory Visit: Payer: BLUE CROSS/BLUE SHIELD | Admitting: Registered"

## 2018-02-23 DIAGNOSIS — O09512 Supervision of elderly primigravida, second trimester: Secondary | ICD-10-CM | POA: Diagnosis not present

## 2018-02-23 DIAGNOSIS — Z3A18 18 weeks gestation of pregnancy: Secondary | ICD-10-CM | POA: Diagnosis not present

## 2018-02-23 DIAGNOSIS — O24410 Gestational diabetes mellitus in pregnancy, diet controlled: Secondary | ICD-10-CM | POA: Diagnosis not present

## 2018-03-25 DIAGNOSIS — O24410 Gestational diabetes mellitus in pregnancy, diet controlled: Secondary | ICD-10-CM | POA: Diagnosis not present

## 2018-03-25 DIAGNOSIS — Z3A23 23 weeks gestation of pregnancy: Secondary | ICD-10-CM | POA: Diagnosis not present

## 2018-03-25 DIAGNOSIS — O09512 Supervision of elderly primigravida, second trimester: Secondary | ICD-10-CM | POA: Diagnosis not present

## 2018-04-15 DIAGNOSIS — O24410 Gestational diabetes mellitus in pregnancy, diet controlled: Secondary | ICD-10-CM | POA: Diagnosis not present

## 2018-04-15 DIAGNOSIS — O09512 Supervision of elderly primigravida, second trimester: Secondary | ICD-10-CM | POA: Diagnosis not present

## 2018-04-15 DIAGNOSIS — D573 Sickle-cell trait: Secondary | ICD-10-CM | POA: Diagnosis not present

## 2018-04-15 DIAGNOSIS — Z3A26 26 weeks gestation of pregnancy: Secondary | ICD-10-CM | POA: Diagnosis not present

## 2018-04-28 DIAGNOSIS — Z3A28 28 weeks gestation of pregnancy: Secondary | ICD-10-CM | POA: Diagnosis not present

## 2018-04-28 DIAGNOSIS — Z3689 Encounter for other specified antenatal screening: Secondary | ICD-10-CM | POA: Diagnosis not present

## 2018-04-28 DIAGNOSIS — O24410 Gestational diabetes mellitus in pregnancy, diet controlled: Secondary | ICD-10-CM | POA: Diagnosis not present

## 2018-04-28 DIAGNOSIS — O09512 Supervision of elderly primigravida, second trimester: Secondary | ICD-10-CM | POA: Diagnosis not present

## 2018-04-28 DIAGNOSIS — Z23 Encounter for immunization: Secondary | ICD-10-CM | POA: Diagnosis not present

## 2018-04-28 LAB — OB RESULTS CONSOLE RPR: RPR: NONREACTIVE

## 2018-05-20 DIAGNOSIS — O24410 Gestational diabetes mellitus in pregnancy, diet controlled: Secondary | ICD-10-CM | POA: Diagnosis not present

## 2018-05-20 DIAGNOSIS — O3663X0 Maternal care for excessive fetal growth, third trimester, not applicable or unspecified: Secondary | ICD-10-CM | POA: Diagnosis not present

## 2018-05-20 DIAGNOSIS — Z3A31 31 weeks gestation of pregnancy: Secondary | ICD-10-CM | POA: Diagnosis not present

## 2018-06-03 DIAGNOSIS — Z3A33 33 weeks gestation of pregnancy: Secondary | ICD-10-CM | POA: Diagnosis not present

## 2018-06-03 DIAGNOSIS — O3663X0 Maternal care for excessive fetal growth, third trimester, not applicable or unspecified: Secondary | ICD-10-CM | POA: Diagnosis not present

## 2018-06-03 DIAGNOSIS — O24410 Gestational diabetes mellitus in pregnancy, diet controlled: Secondary | ICD-10-CM | POA: Diagnosis not present

## 2018-06-17 DIAGNOSIS — O24410 Gestational diabetes mellitus in pregnancy, diet controlled: Secondary | ICD-10-CM | POA: Diagnosis not present

## 2018-06-17 DIAGNOSIS — O3663X0 Maternal care for excessive fetal growth, third trimester, not applicable or unspecified: Secondary | ICD-10-CM | POA: Diagnosis not present

## 2018-06-17 DIAGNOSIS — Z3685 Encounter for antenatal screening for Streptococcus B: Secondary | ICD-10-CM | POA: Diagnosis not present

## 2018-06-17 DIAGNOSIS — O24419 Gestational diabetes mellitus in pregnancy, unspecified control: Secondary | ICD-10-CM | POA: Diagnosis not present

## 2018-06-17 DIAGNOSIS — Z3A35 35 weeks gestation of pregnancy: Secondary | ICD-10-CM | POA: Diagnosis not present

## 2018-06-17 LAB — OB RESULTS CONSOLE GBS: GBS: NEGATIVE

## 2018-06-24 DIAGNOSIS — O24410 Gestational diabetes mellitus in pregnancy, diet controlled: Secondary | ICD-10-CM | POA: Diagnosis not present

## 2018-06-24 DIAGNOSIS — Z3A36 36 weeks gestation of pregnancy: Secondary | ICD-10-CM | POA: Diagnosis not present

## 2018-06-24 DIAGNOSIS — O09513 Supervision of elderly primigravida, third trimester: Secondary | ICD-10-CM | POA: Diagnosis not present

## 2018-07-01 DIAGNOSIS — O09513 Supervision of elderly primigravida, third trimester: Secondary | ICD-10-CM | POA: Diagnosis not present

## 2018-07-01 DIAGNOSIS — O24410 Gestational diabetes mellitus in pregnancy, diet controlled: Secondary | ICD-10-CM | POA: Diagnosis not present

## 2018-07-01 DIAGNOSIS — Z3A37 37 weeks gestation of pregnancy: Secondary | ICD-10-CM | POA: Diagnosis not present

## 2018-07-07 DIAGNOSIS — O09513 Supervision of elderly primigravida, third trimester: Secondary | ICD-10-CM | POA: Diagnosis not present

## 2018-07-07 DIAGNOSIS — O24410 Gestational diabetes mellitus in pregnancy, diet controlled: Secondary | ICD-10-CM | POA: Diagnosis not present

## 2018-07-07 DIAGNOSIS — O26893 Other specified pregnancy related conditions, third trimester: Secondary | ICD-10-CM | POA: Diagnosis not present

## 2018-07-07 DIAGNOSIS — Z3A38 38 weeks gestation of pregnancy: Secondary | ICD-10-CM | POA: Diagnosis not present

## 2018-07-08 DIAGNOSIS — O24410 Gestational diabetes mellitus in pregnancy, diet controlled: Secondary | ICD-10-CM | POA: Diagnosis not present

## 2018-07-08 DIAGNOSIS — O09513 Supervision of elderly primigravida, third trimester: Secondary | ICD-10-CM | POA: Diagnosis not present

## 2018-07-08 DIAGNOSIS — Z3A38 38 weeks gestation of pregnancy: Secondary | ICD-10-CM | POA: Diagnosis not present

## 2018-07-09 ENCOUNTER — Other Ambulatory Visit: Payer: Self-pay | Admitting: Obstetrics & Gynecology

## 2018-07-13 ENCOUNTER — Other Ambulatory Visit: Payer: Self-pay

## 2018-07-13 ENCOUNTER — Inpatient Hospital Stay (HOSPITAL_COMMUNITY)
Admission: AD | Admit: 2018-07-13 | Discharge: 2018-07-13 | Disposition: A | Discharge status: Home / Self Care | Payer: BC Managed Care – PPO | Attending: Obstetrics & Gynecology | Admitting: Obstetrics & Gynecology

## 2018-07-13 ENCOUNTER — Encounter (HOSPITAL_COMMUNITY): Payer: Self-pay | Admitting: *Deleted

## 2018-07-13 DIAGNOSIS — O09513 Supervision of elderly primigravida, third trimester: Secondary | ICD-10-CM | POA: Diagnosis not present

## 2018-07-13 DIAGNOSIS — Z3A38 38 weeks gestation of pregnancy: Secondary | ICD-10-CM | POA: Diagnosis not present

## 2018-07-13 DIAGNOSIS — Z0371 Encounter for suspected problem with amniotic cavity and membrane ruled out: Secondary | ICD-10-CM | POA: Diagnosis not present

## 2018-07-13 DIAGNOSIS — O26893 Other specified pregnancy related conditions, third trimester: Secondary | ICD-10-CM | POA: Diagnosis not present

## 2018-07-13 DIAGNOSIS — Z3689 Encounter for other specified antenatal screening: Secondary | ICD-10-CM | POA: Diagnosis not present

## 2018-07-13 LAB — AMNISURE RUPTURE OF MEMBRANE (ROM) NOT AT ARMC: Amnisure ROM: NEGATIVE

## 2018-07-13 LAB — POCT FERN TEST: POCT Fern Test: NEGATIVE

## 2018-07-13 NOTE — MAU Note (Signed)
Presents with c/o "water broke" @ 0720 this morning.  Reports fluid clear.  Denies VB.  Reports +FM.

## 2018-07-13 NOTE — MAU Provider Note (Signed)
S: Ms. Donna Morrison is a 38 y.o. G1P0 at [redacted]w[redacted]d  who presents to MAU today complaining of leaking of fluid since this morning. Pt reports three large gushes of clear, odorless fluid. She denies vaginal bleeding. She endorses contractions. She reports normal fetal movement.    O: BP 127/81 (BP Location: Right Arm)   Pulse 81   Temp 97.9 F (36.6 C) (Oral)   Resp 20   Ht 5\' 4"  (1.626 m)   Wt 99.5 kg   SpO2 100%   BMI 37.67 kg/m  GENERAL: Well-developed, well-nourished female in no acute distress.  HEAD: Normocephalic, atraumatic.  CHEST: Normal effort of breathing, regular heart rate ABDOMEN: Soft, nontender, gravid PELVIC: Normal external female genitalia. Vagina is pink and rugated. Cervix with normal contour, no lesions. Normal discharge.  No pooling.   Cervical exam:  Dilation: Fingertip Effacement (%): Thick Cervical Position: Posterior Station: Ballotable Exam by:: nugent np   Fetal Monitoring: Baseline: 140/145 Variability: moderate Accelerations: present, 15x15 Decelerations: absent Contractions: irregular, q5-19min  Results for orders placed or performed during the hospital encounter of 07/13/18 (from the past 24 hour(s))  POCT fern test     Status: None   Collection Time: 07/13/18 10:18 AM  Result Value Ref Range   POCT Fern Test Negative = intact amniotic membranes   Amnisure rupture of membrane (rom)not at [facility]     Status: None   Collection Time: 07/13/18 10:18 AM  Result Value Ref Range   Amnisure ROM NEGATIVE     Fern negative by RN. Fern by provider negative. Amnisure: negative.  A: SIUP at [redacted]w[redacted]d  Membranes intact  P: Report given to RN to contact MD on call for further instructions  Nugent, Gerrie Nordmann, NP 07/13/2018 10:49 AM

## 2018-07-13 NOTE — Discharge Instructions (Signed)

## 2018-07-15 DIAGNOSIS — O09513 Supervision of elderly primigravida, third trimester: Secondary | ICD-10-CM | POA: Diagnosis not present

## 2018-07-15 DIAGNOSIS — Z3A39 39 weeks gestation of pregnancy: Secondary | ICD-10-CM | POA: Diagnosis not present

## 2018-07-15 DIAGNOSIS — O24410 Gestational diabetes mellitus in pregnancy, diet controlled: Secondary | ICD-10-CM | POA: Diagnosis not present

## 2018-07-15 DIAGNOSIS — D573 Sickle-cell trait: Secondary | ICD-10-CM | POA: Diagnosis not present

## 2018-07-16 ENCOUNTER — Encounter (HOSPITAL_COMMUNITY): Payer: Self-pay

## 2018-07-16 ENCOUNTER — Telehealth (HOSPITAL_COMMUNITY): Payer: Self-pay

## 2018-07-16 DIAGNOSIS — M5489 Other dorsalgia: Secondary | ICD-10-CM | POA: Diagnosis not present

## 2018-07-16 NOTE — Telephone Encounter (Signed)
Preadmission screen  

## 2018-07-20 ENCOUNTER — Other Ambulatory Visit (HOSPITAL_COMMUNITY)
Admission: RE | Admit: 2018-07-20 | Discharge: 2018-07-20 | Disposition: A | Discharge status: Home / Self Care | Payer: BC Managed Care – PPO | Source: Ambulatory Visit | Attending: Obstetrics & Gynecology | Admitting: Obstetrics & Gynecology

## 2018-07-20 ENCOUNTER — Other Ambulatory Visit: Payer: Self-pay

## 2018-07-20 DIAGNOSIS — Z1159 Encounter for screening for other viral diseases: Secondary | ICD-10-CM | POA: Diagnosis not present

## 2018-07-20 DIAGNOSIS — Z01812 Encounter for preprocedural laboratory examination: Secondary | ICD-10-CM | POA: Diagnosis not present

## 2018-07-20 LAB — SARS CORONAVIRUS 2 (TAT 6-24 HRS): SARS Coronavirus 2: NEGATIVE

## 2018-07-20 NOTE — MAU Note (Signed)
Swab collected without difficulty. 

## 2018-07-22 ENCOUNTER — Other Ambulatory Visit: Payer: Self-pay

## 2018-07-22 ENCOUNTER — Inpatient Hospital Stay (HOSPITAL_COMMUNITY): Payer: BC Managed Care – PPO

## 2018-07-22 ENCOUNTER — Inpatient Hospital Stay (HOSPITAL_COMMUNITY): Payer: BC Managed Care – PPO | Admitting: Anesthesiology

## 2018-07-22 ENCOUNTER — Inpatient Hospital Stay (HOSPITAL_COMMUNITY)
Admission: AD | Admit: 2018-07-22 | Discharge: 2018-07-25 | DRG: 806 | Disposition: A | Discharge status: Home / Self Care | Payer: BC Managed Care – PPO | Source: Intra-hospital | Attending: Obstetrics & Gynecology | Admitting: Obstetrics & Gynecology

## 2018-07-22 ENCOUNTER — Encounter (HOSPITAL_COMMUNITY): Payer: Self-pay | Admitting: *Deleted

## 2018-07-22 DIAGNOSIS — O24420 Gestational diabetes mellitus in childbirth, diet controlled: Secondary | ICD-10-CM | POA: Diagnosis not present

## 2018-07-22 DIAGNOSIS — D62 Acute posthemorrhagic anemia: Secondary | ICD-10-CM | POA: Diagnosis not present

## 2018-07-22 DIAGNOSIS — O24410 Gestational diabetes mellitus in pregnancy, diet controlled: Secondary | ICD-10-CM | POA: Diagnosis not present

## 2018-07-22 DIAGNOSIS — O24429 Gestational diabetes mellitus in childbirth, unspecified control: Secondary | ICD-10-CM | POA: Diagnosis not present

## 2018-07-22 DIAGNOSIS — Z3490 Encounter for supervision of normal pregnancy, unspecified, unspecified trimester: Secondary | ICD-10-CM | POA: Diagnosis present

## 2018-07-22 DIAGNOSIS — Z3A40 40 weeks gestation of pregnancy: Secondary | ICD-10-CM

## 2018-07-22 DIAGNOSIS — O9081 Anemia of the puerperium: Secondary | ICD-10-CM | POA: Diagnosis not present

## 2018-07-22 DIAGNOSIS — D573 Sickle-cell trait: Secondary | ICD-10-CM | POA: Diagnosis not present

## 2018-07-22 DIAGNOSIS — Z412 Encounter for routine and ritual male circumcision: Secondary | ICD-10-CM | POA: Diagnosis not present

## 2018-07-22 LAB — GLUCOSE, CAPILLARY
Glucose-Capillary: 98 mg/dL (ref 70–99)
Glucose-Capillary: 110 mg/dL — ABNORMAL HIGH (ref 70–99)
Glucose-Capillary: 85 mg/dL (ref 70–99)
Glucose-Capillary: 91 mg/dL (ref 70–99)

## 2018-07-22 LAB — COMPREHENSIVE METABOLIC PANEL
ALT: 16 U/L (ref 0–44)
AST: 25 U/L (ref 15–41)
Albumin: 2.8 g/dL — ABNORMAL LOW (ref 3.5–5.0)
Alkaline Phosphatase: 221 U/L — ABNORMAL HIGH (ref 38–126)
Anion gap: 7 (ref 5–15)
BUN: <5 mg/dL — ABNORMAL LOW (ref 6–20)
CO2: 23 mmol/L (ref 22–32)
Calcium: 10.1 mg/dL (ref 8.9–10.3)
Chloride: 108 mmol/L (ref 98–111)
Creatinine, Ser: 0.83 mg/dL (ref 0.44–1.00)
GFR calc Af Amer: >60 mL/min (ref >60)
GFR calc non Af Amer: >60 mL/min (ref >60)
Glucose, Bld: 88 mg/dL (ref 70–99)
Potassium: 4.2 mmol/L (ref 3.5–5.1)
Sodium: 138 mmol/L (ref 135–145)
Total Bilirubin: 0.7 mg/dL (ref 0.3–1.2)
Total Protein: 5.8 g/dL — ABNORMAL LOW (ref 6.5–8.1)

## 2018-07-22 LAB — CBC
HCT: 34.3 % — ABNORMAL LOW (ref 36.0–46.0)
Hemoglobin: 11.2 g/dL — ABNORMAL LOW (ref 12.0–15.0)
MCH: 26.4 pg (ref 26.0–34.0)
MCHC: 32.7 g/dL (ref 30.0–36.0)
MCV: 80.9 fL (ref 80.0–100.0)
Platelets: 225 10*3/uL (ref 150–400)
RBC: 4.24 MIL/uL (ref 3.87–5.11)
RDW: 15.9 % — ABNORMAL HIGH (ref 11.5–15.5)
WBC: 5.8 10*3/uL (ref 4.0–10.5)
nRBC: 0 % (ref 0.0–0.2)

## 2018-07-22 LAB — TYPE AND SCREEN
ABO/RH(D): O POS
Antibody Screen: NEGATIVE

## 2018-07-22 LAB — PROTEIN / CREATININE RATIO, URINE
Creatinine, Urine: 64.2 mg/dL
Protein Creatinine Ratio: 0.16 mg/mg{Cre} — ABNORMAL HIGH (ref 0.00–0.15)
Total Protein, Urine: 10 mg/dL

## 2018-07-22 LAB — RPR: RPR Ser Ql: NONREACTIVE

## 2018-07-22 LAB — URIC ACID: Uric Acid, Serum: 4.9 mg/dL (ref 2.5–7.1)

## 2018-07-22 MED ORDER — LACTATED RINGERS IV SOLN
INTRAVENOUS | Status: DC
  Administered 2018-07-22 – 2018-07-23 (×3): via INTRAVENOUS
  Administered 2018-07-23: 500 mL via INTRAVENOUS

## 2018-07-22 MED ORDER — TERBUTALINE SULFATE 1 MG/ML IJ SOLN: 0.2500 mg | Freq: Once | INTRAMUSCULAR | Status: DC | PRN

## 2018-07-22 MED ORDER — DIPHENHYDRAMINE HCL 50 MG/ML IJ SOLN: 12.5000 mg | INTRAMUSCULAR | Status: DC | PRN

## 2018-07-22 MED ORDER — ACETAMINOPHEN 325 MG PO TABS: 650.0000 mg | ORAL_TABLET | ORAL | Status: DC | PRN

## 2018-07-22 MED ORDER — EPHEDRINE 5 MG/ML INJ: 10.0000 mg | INTRAVENOUS | Status: DC | PRN

## 2018-07-22 MED ORDER — MISOPROSTOL 25 MCG QUARTER TABLET
25.0000 ug | ORAL_TABLET | ORAL | Status: DC | PRN
  Administered 2018-07-22: 25 ug via VAGINAL
  Filled 2018-07-22 (×2): qty 1

## 2018-07-22 MED ORDER — LACTATED RINGERS IV SOLN: 500.0000 mL | Freq: Once | INTRAVENOUS | Status: DC

## 2018-07-22 MED ORDER — LIDOCAINE HCL (PF) 1 % IJ SOLN
30.0000 mL | INTRAMUSCULAR | Status: AC | PRN
  Administered 2018-07-23: 30 mL via SUBCUTANEOUS
  Filled 2018-07-22: qty 30

## 2018-07-22 MED ORDER — FENTANYL-BUPIVACAINE-NACL 0.5-0.125-0.9 MG/250ML-% EP SOLN
12.0000 mL/h | EPIDURAL | Status: DC | PRN
  Filled 2018-07-22: qty 250

## 2018-07-22 MED ORDER — PHENYLEPHRINE 40 MCG/ML (10ML) SYRINGE FOR IV PUSH (FOR BLOOD PRESSURE SUPPORT)
80.0000 ug | PREFILLED_SYRINGE | INTRAVENOUS | Status: DC | PRN
  Filled 2018-07-22: qty 10

## 2018-07-22 MED ORDER — SOD CITRATE-CITRIC ACID 500-334 MG/5ML PO SOLN: 30.0000 mL | ORAL | Status: DC | PRN

## 2018-07-22 MED ORDER — OXYTOCIN 40 UNITS IN NORMAL SALINE INFUSION - SIMPLE MED
1.0000 m[IU]/min | INTRAVENOUS | Status: DC
  Administered 2018-07-22: 1 m[IU]/min via INTRAVENOUS

## 2018-07-22 MED ORDER — LACTATED RINGERS IV SOLN: 500.0000 mL | INTRAVENOUS | Status: DC | PRN

## 2018-07-22 MED ORDER — PHENYLEPHRINE 40 MCG/ML (10ML) SYRINGE FOR IV PUSH (FOR BLOOD PRESSURE SUPPORT): 80.0000 ug | PREFILLED_SYRINGE | INTRAVENOUS | Status: DC | PRN

## 2018-07-22 MED ORDER — ONDANSETRON HCL 4 MG/2ML IJ SOLN: 4.0000 mg | Freq: Four times a day (QID) | INTRAMUSCULAR | Status: DC | PRN

## 2018-07-22 MED ORDER — OXYTOCIN 40 UNITS IN NORMAL SALINE INFUSION - SIMPLE MED: 1.0000 m[IU]/min | INTRAVENOUS | Status: DC

## 2018-07-22 MED ORDER — OXYTOCIN 40 UNITS IN NORMAL SALINE INFUSION - SIMPLE MED
2.5000 [IU]/h | INTRAVENOUS | Status: DC
  Filled 2018-07-22: qty 1000

## 2018-07-22 MED ORDER — OXYTOCIN BOLUS FROM INFUSION
500.0000 mL | Freq: Once | INTRAVENOUS | Status: AC
  Administered 2018-07-23: 500 mL via INTRAVENOUS

## 2018-07-22 NOTE — Anesthesia Preprocedure Evaluation (Signed)
Anesthesia Evaluation  Patient identified by MRN, date of birth, ID band Patient awake    Reviewed: Allergy & Precautions, Patient's Chart, lab work & pertinent test results  Airway Mallampati: II       Dental   Pulmonary    Pulmonary exam normal        Cardiovascular Normal cardiovascular exam     Neuro/Psych    GI/Hepatic   Endo/Other  diabetes, Gestational  Renal/GU      Musculoskeletal   Abdominal   Peds  Hematology  (+) anemia ,   Anesthesia Other Findings   Reproductive/Obstetrics (+) Pregnancy                             Anesthesia Physical Anesthesia Plan  ASA: II  Anesthesia Plan: Epidural   Post-op Pain Management:    Induction:   PONV Risk Score and Plan: Treatment may vary due to age or medical condition  Airway Management Planned: Natural Airway  Additional Equipment:   Intra-op Plan:   Post-operative Plan:   Informed Consent: I have reviewed the patients History and Physical, chart, labs and discussed the procedure including the risks, benefits and alternatives for the proposed anesthesia with the patient or authorized representative who has indicated his/her understanding and acceptance.       Plan Discussed with:   Anesthesia Plan Comments:         Anesthesia Quick Evaluation

## 2018-07-22 NOTE — Anesthesia Procedure Notes (Signed)
Epidural Patient location during procedure: OB Start time: 07/22/2018 4:24 PM End time: 07/22/2018 4:30 PM  Staffing Anesthesiologist: Suzette Battiest, MD Performed: anesthesiologist   Preanesthetic Checklist Completed: patient identified, site marked, surgical consent, pre-op evaluation, timeout performed, IV checked, risks and benefits discussed and monitors and equipment checked  Epidural Patient position: sitting Prep: site prepped and draped and DuraPrep Patient monitoring: continuous pulse ox and blood pressure Approach: midline Location: L3-L4 Injection technique: LOR air  Needle:  Needle type: Tuohy  Needle gauge: 17 G Needle length: 9 cm and 9 Needle insertion depth: 8 cm Catheter type: closed end flexible Catheter size: 19 Gauge Catheter at skin depth: 13 cm Test dose: negative  Assessment Events: blood not aspirated, injection not painful, no injection resistance, negative IV test and no paresthesia

## 2018-07-22 NOTE — Progress Notes (Signed)
Donna Morrison is a 38 y.o. G1P0 at [redacted]w[redacted]d by ultrasound admitted for induction of labor due to Gestational diabetes- A1.  Subjective: S/p epidural   Objective: BP 131/74   Pulse 77   Temp 98.9 F (37.2 C) (Oral)   Resp 16   Ht 5\' 4"  (1.626 m)   Wt 100.7 kg   BMI 38.09 kg/m  No intake/output data recorded. No intake/output data recorded.  FHT:  FHR: 130 bpm, variability: moderate,  accelerations:  Present,  decelerations:  Absent UC:   irregular, every 2-5 minutes SVE:   Dilation: 2 Effacement (%): 80, 90 Station: -3 Exam by:: Ignacia Felling, RN Pito at 3, will continue to titrate   Labs: Lab Results  Component Value Date   WBC 5.8 07/22/2018   HGB 11.2 (L) 07/22/2018   HCT 34.3 (L) 07/22/2018   MCV 80.9 07/22/2018   PLT 225 07/22/2018    Assessment / Plan: 40.1 wks, G1, IOL for A1GDM, transient elevated BPs at admission, [place] labs nl. BS q4hrs Borderline pelvis remains concerning with stn above the inlet but is well applied to cervix.   Labor: early labor  Preeclampsia:  none Fetal Wellbeing:  Category I Pain Control:  Epidural I/D:  n/a Anticipated MOD:  guarded  Elveria Royals 07/22/2018, 5:20 PM

## 2018-07-22 NOTE — Progress Notes (Addendum)
Patient ID: Donna Morrison, female   DOB: October 20, 1980, 38 y.o.   MRN: [id number] FHT cat I Toco q 2- min, pitocin at 1 mu rate SVE 1/70%/ ballotable. Cervical foley attempt failed.  Controlled AROM done with FSE to make small puncture, fundal pressure maintained. Head still above the inlet, but well applied to cervix after copious clear amniotic fluid drained  Closely monitor FHT for decels, monitor progress, esp descent   Anticipate MOD- guarded, pt aware  PIH labs nl, Nl urine PC ration  V.Benjie Karvonen, MD

## 2018-07-22 NOTE — H&P (Signed)
Donna Morrison is a 38 y.o. female presenting for labor IOL at 48 wks for A1GDM PCOS, insulin resistance, infertility Hx, this is spontaneous pregnancy. PNCare from 7 wks.  AMA- nl Panorama XY SS trait (urine culture neg each trim and FOB is neg)  GDM diagnosis at 15 weeks with early Glucola of 211. Diet controlled with F 40-60s and PP <120 with occ 130s until 34-35 wks after which F went up 110s and few even higher and PP several 140s, few higher, possible from diet indiscretion as well.  Growth scan at 28 wks AGA 55% and AC 46%, AFI 20 cm                       At 36 wks 6'12" 80% and AC 80% and AFI 30 cm                        AFI at 38 wks better at 24 cm ANtesting NST reactive  Pelvis is borderline.   OB History    Gravida  1   Para      Term      Preterm      AB      Living        SAB      TAB      Ectopic      Multiple      Live Births             Past Medical History:  Diagnosis Date  . BV (bacterial vaginosis)   . Gestational diabetes   . Hirsutism   . PCOS (polycystic ovarian syndrome)    History reviewed. No pertinent surgical history. Family History: family history is not on file. Social History:  reports that she has never smoked. She has never used smokeless tobacco. She reports that she does not drink alcohol or use drugs.     Maternal Diabetes: Yes:  Diabetes Type:  Diet controlled Genetic Screening: Normal Panorama Maternal Ultrasounds/Referrals: Normal Fetal Ultrasounds or other Referrals:  None Maternal Substance Abuse:  No Significant Maternal Medications: none Significant Maternal Lab Results:  Group B Strep negative. SStrait (FOB is neg) Other Comments:  GDM control got worse only after 34-35 wks  ROS History Dilation: 1 Effacement (%): 70 Station: Ballotable Exam by:: Wanza Szumski Blood pressure (!) 154/96, pulse 77, temperature 98.7 F (37.1 C), resp. rate 16, height 5\' 4"  (1.626 m), weight 100.7 kg. Exam Physical Exam  Physical  exam:  A&O x 3, no acute distress. Pleasant HEENT neg, no thyromegaly Lungs CTA bilat CV RRR, S1S2 normal Abdo soft, non tender, non acute Extr no edema/ tenderness Pelvic closed, soft, ballotable at admission FHT  130s + accels no decels mod variab- cat I Toco irreg   Prenatal labs: ABO, Rh: --/--/O POS (07/02 0700) Antibody: NEG (07/02 0700) Rubella: Immune (12/11 0000) RPR: Non Reactive (07/02 0700)  HBsAg: Negative (12/11 0000)  HIV: Non-reactive (12/11 0000)  GBS: Negative (05/28 0000)  1hr Glucola at 15 wks - 211 Panorama nl, XY AFP1 neg SStrait pos  Assessment/Plan: 39 yo G1 at 40.1 wks with A1GDM, here for IOL Cytotec x 1 dose, then pitocin. Early controlled AROM due to unengaged vertex and borderline pelvis, risk of cord prolapse, risk of C/section reviewed.  FHT cat I GBS(-) SS trait (FOB neg) AMA  Elevated BP at admission, [facility] labs   Elveria Royals 07/22/2018

## 2018-07-23 ENCOUNTER — Encounter (HOSPITAL_COMMUNITY): Payer: Self-pay

## 2018-07-23 DIAGNOSIS — O24410 Gestational diabetes mellitus in pregnancy, diet controlled: Secondary | ICD-10-CM | POA: Diagnosis present

## 2018-07-23 LAB — ABO/RH: ABO/RH(D): O POS

## 2018-07-23 LAB — GLUCOSE, CAPILLARY
Glucose-Capillary: 92 mg/dL (ref 70–99)
Glucose-Capillary: 93 mg/dL (ref 70–99)
Glucose-Capillary: 94 mg/dL (ref 70–99)

## 2018-07-23 MED ORDER — ONDANSETRON HCL 4 MG PO TABS: 4.0000 mg | ORAL_TABLET | ORAL | Status: DC | PRN

## 2018-07-23 MED ORDER — WITCH HAZEL-GLYCERIN EX PADS: 1.0000 "application " | MEDICATED_PAD | CUTANEOUS | Status: DC | PRN

## 2018-07-23 MED ORDER — ACETAMINOPHEN 325 MG PO TABS
650.0000 mg | ORAL_TABLET | ORAL | Status: DC | PRN
  Administered 2018-07-23: 650 mg via ORAL
  Filled 2018-07-23: qty 2

## 2018-07-23 MED ORDER — SIMETHICONE 80 MG PO CHEW: 80.0000 mg | CHEWABLE_TABLET | ORAL | Status: DC | PRN

## 2018-07-23 MED ORDER — SENNOSIDES-DOCUSATE SODIUM 8.6-50 MG PO TABS
2.0000 | ORAL_TABLET | ORAL | Status: DC
  Administered 2018-07-24 (×2): 2 via ORAL
  Filled 2018-07-23 (×2): qty 2

## 2018-07-23 MED ORDER — ONDANSETRON HCL 4 MG/2ML IJ SOLN: 4.0000 mg | INTRAMUSCULAR | Status: DC | PRN

## 2018-07-23 MED ORDER — TETANUS-DIPHTH-ACELL PERTUSSIS 5-2.5-18.5 LF-MCG/0.5 IM SUSP: 0.5000 mL | Freq: Once | INTRAMUSCULAR | Status: DC

## 2018-07-23 MED ORDER — FAMOTIDINE IN NACL 20-0.9 MG/50ML-% IV SOLN
20.0000 mg | Freq: Once | INTRAVENOUS | Status: DC
  Filled 2018-07-23: qty 50

## 2018-07-23 MED ORDER — COCONUT OIL OIL: 1.0000 "application " | TOPICAL_OIL | Status: DC | PRN

## 2018-07-23 MED ORDER — DIPHENHYDRAMINE HCL 25 MG PO CAPS: 25.0000 mg | ORAL_CAPSULE | Freq: Four times a day (QID) | ORAL | Status: DC | PRN

## 2018-07-23 MED ORDER — PRENATAL MULTIVITAMIN CH
1.0000 | ORAL_TABLET | Freq: Every day | ORAL | Status: DC
  Administered 2018-07-24: 1 via ORAL
  Filled 2018-07-23: qty 1

## 2018-07-23 MED ORDER — IBUPROFEN 600 MG PO TABS
600.0000 mg | ORAL_TABLET | Freq: Four times a day (QID) | ORAL | Status: DC
  Administered 2018-07-23 – 2018-07-25 (×7): 600 mg via ORAL
  Filled 2018-07-23 (×6): qty 1

## 2018-07-23 MED ORDER — BENZOCAINE-MENTHOL 20-0.5 % EX AERO
1.0000 "application " | INHALATION_SPRAY | CUTANEOUS | Status: DC | PRN
  Administered 2018-07-23 – 2018-07-24 (×2): 1 via TOPICAL
  Filled 2018-07-23 (×2): qty 56

## 2018-07-23 MED ORDER — DIBUCAINE (PERIANAL) 1 % EX OINT: 1.0000 "application " | TOPICAL_OINTMENT | CUTANEOUS | Status: DC | PRN

## 2018-07-23 NOTE — Progress Notes (Signed)
Donna Morrison is a 38 y.o. G1P0 at [redacted]w[redacted]d by ultrasound admitted for induction of labor due to Gestational diabetes- A1.  BP (!) 125/56   Pulse 88   Temp 97.7 F (36.5 C) (Oral)   Resp 18   Ht 5\' 4"  (1.626 m)   Wt 100.7 kg   BMI 38.09 kg/m  No intake/output data recorded. Total I/O In: 2153.2 [I.V.:2153.2] Out: 1350 [EMVVK:1224]   FHR: 130 bpm, variability: moderate,  accelerations:  Present,  decelerations:  Absent UC:   irregular, every 2-3 minutes SVE:  8/100%/-2 with caput coning in at pelvic inlet.   Assessment / Plan: Active phase, high station, feels like only caput coning in. Reassess in 2 hrs, okay to continue process as FHT cat I and mom stable   Anticipated MOD:  guarded  Elveria Royals 07/23/2018, 6:45 AM

## 2018-07-23 NOTE — Progress Notes (Addendum)
RONNETTE RUMP is a 38 y.o. G1P0 at [redacted]w[redacted]d by ultrasound admitted for induction of labor due to Gestational diabetes- A1. Second state at 10 am  Pushing after laboring down since 11.20 AM  BP 121/62   Pulse 90   Temp 99.7 F (37.6 C) (Oral)   Resp 20   Ht 5\' 4"  (1.626 m)   Wt 100.7 kg   BMI 38.09 kg/m   FHT: 150s , moderate variability, + accels, + variable decels and intermittent late decels with variability UC:   regular, every 2-3 minutes  Assessment / Plan: Second stage, 2.1/2 hrs,  Recheck at 1 pm   Anticipated MOD:  guarded  Elveria Royals 07/23/2018, 12:30 PM

## 2018-07-23 NOTE — Progress Notes (Signed)
Donna Morrison is a 38 y.o. G1P0 at [redacted]w[redacted]d by ultrasound admitted for induction of labor due to Gestational diabetes- A1.  BP 125/73   Pulse 80   Temp 99 F (37.2 C) (Oral)   Resp 17   Ht 5\' 4"  (1.626 m)   Wt 100.7 kg   BMI 38.09 kg/m  No intake/output data recorded. Total I/O In: 2153.2 [I.V.:2153.2] Out: 1350 [TOIZT:2458]   FHR: 130 bpm, variability: moderate,  accelerations:  Present,  decelerations:  Absent UC:   irregular, every 2-4 minutes SVE:   Dilation: 5 Effacement (%): 100 Station: -2 Exam by:: Glenice Bow, rnc    Assessment / Plan: Long latent phase, high station, borderline pelvis remains concerning. Monitor descent  40.1 wks, G1, IOL for A1GDM, transient elevated BPs at admission, [place] labs nl. BS q4hrs Anticipated MOD:  guarded  Elveria Royals 07/23/2018, 4:05 AM

## 2018-07-23 NOTE — Progress Notes (Signed)
Donna Morrison is a 38 y.o. G1P0 at [redacted]w[redacted]d by ultrasound admitted for induction of labor due to Gestational diabetes- A1.  BP 122/68   Pulse 75   Temp 98.2 F (36.8 C) (Oral)   Resp 18   Ht 5\' 4"  (1.626 m)   Wt 100.7 kg   BMI 38.09 kg/m  I/O last 3 completed shifts: In: 2706.7 [I.V.:2706.7] Out: 2050 [Urine:2050] No intake/output data recorded.   FHR: 130 bpm, variability: moderate,  accelerations:  Present,  decelerations:  Absent UC:   regular, every 2-3 minutes SVE:  Complete and 0 station per RN  Assessment / Plan: Second stage Labor down and reassess of ready to push once station at +1/+2  FHT cat I  Anticipated MOD:  guarded  Elveria Royals 07/23/2018, 10:30 AM

## 2018-07-24 MED ORDER — POLYSACCHARIDE IRON COMPLEX 150 MG PO CAPS
150.0000 mg | ORAL_CAPSULE | Freq: Every day | ORAL | Status: DC
  Administered 2018-07-25: 150 mg via ORAL
  Filled 2018-07-24: qty 1

## 2018-07-24 MED ORDER — MAGNESIUM OXIDE 400 (241.3 MG) MG PO TABS
400.0000 mg | ORAL_TABLET | Freq: Every day | ORAL | Status: DC
  Administered 2018-07-25: 400 mg via ORAL
  Filled 2018-07-24: qty 1

## 2018-07-24 NOTE — Progress Notes (Addendum)
PPD #1,SVD, left mediolateral episiotomy (2nd degree), baby boy "Haze Boyden"  S:  Reports feeling sore, but well. C/o abdominal tenderness/cramping and back pain at epidural site. States she had some mild lightheadedness when she was getting out of the shower, but none since.              Tolerating po/ No nausea or vomiting / Denies CP, SOB             Bleeding is moderate             Pain controlled with Motrin, Tylenol, Dermoplast spray             Up ad lib / ambulatory / voiding QS  Newborn formula feeding  - states she tried breastfeeding yesterday, but does not think it is for her / Circumcision - planning today   O:               VS: BP 126/75 (BP Location: Left Arm)   Pulse 80   Temp 98 F (36.7 C) (Oral)   Resp 16   Ht 5\' 4"  (1.626 m)   Wt 100.7 kg   SpO2 99%   Breastfeeding Unknown   BMI 38.09 kg/m    LABS:              Recent Labs    07/22/18 0700 07/24/18 0447  WBC 5.8 16.1*  HGB 11.2* 8.9*  PLT 225 193               Blood type: --/--/O POS, O POS (07/02 0700)  Rubella: Immune (12/11 0000)                     I&O: Intake/Output      07/03 0701 - 07/04 0700 07/04 0701 - 07/05 0700   I.V. (mL/kg) 1426.5 (14.2)    Total Intake(mL/kg) 1426.5 (14.2)    Urine (mL/kg/hr) 0 (0)    Blood 1007    Total Output 1007    Net +419.5         Urine Occurrence 2 x                  Physical Exam:             Alert and oriented X3  Lungs: Clear and unlabored  Heart: regular rate and rhythm / no murmurs  Abdomen: soft, non-tender, non-distended, obese               Fundus: firm, non-tender, U+2 (patient states she recently voided)  Perineum: well approximated episiotomy, minimal edema, no erythema or ecchymosis  Lochia: scant rubra on pad   Extremities: no edema, no calf pain or tenderness    A/P: PPD # 1, SVD  A1DGM - 2hr GTT at 6 weeks PP  Left mediolateral episiotomy repair   ABL Anemia compounding chronic IDA    - symptomatic after shower, but no other symptoms;  IV site is out; will plan for Niferex 150mg  PO daily   - Magnesium oxide 400mg  PO daily    - informed patient if she become symptomatic again, I recommend IV FE   Doing well - stable status  Routine post partum orders  Encouraged to empty bladder every 2-3 hours      Lars Pinks, MSN, CNM [facility]

## 2018-07-24 NOTE — Lactation Note (Signed)
This note was copied from a baby's chart. Lactation Consultation Note  Patient Name: Donna Morrison [id number] Date: 07/24/2018 Reason for consult: 1st time breastfeeding;Term P1, 69 hour female infant, mom with hx GDM. Per parents, infant had 2 stools since birth. [place] notice mom has very small nipples that are flat and inverted.  Per mom, infant did not latch in L&D and this is infant's first time latching to breast. Mom taught back hand expression and infant was given 3 ml of colostrum in 16 mm NS using curve tip syringe.  Mom latched infant on right breast using football hold with 16 mm NS and infant breastfeed for 25 minutes. Mom will wear breast shells during day in bra to help evert nipple shaft out more and use harmony hand pump  Pre-pump prior to latching infant with 16 mm NS to breast. Mom knows to breast feed infant according hunger cues 8 to 12 times within 24 hours and on demand.  Mom knows to call Nurse or [place] if she has any questions, concerns or need assistance with latching infant to breast.  Mom knows to use DEBP every 3 hours for 15 minutes on initial setting. Mom shown how to use DEBP & how to disassemble, clean, & reassemble parts by Nurse. LC discussed I & O. Reviewed Baby & Me book's Breastfeeding Basics.  Mom made aware of O/P services, breastfeeding support groups, community resources, and our phone # for post-discharge questions.  Mom's plan first 24 hours: 1. Mom will breastfeed according hunger cues. 2. Mom will pre-pump and apply 16 mm NS prior to latching infant to breast. 3. Mom will wear breast shells in bra during the day. 4. Mom will use DEBP after latching infant to breast  every 3 hours for 15 minutes  Maternal Data Formula Feeding for Exclusion: No Has patient been taught Hand Expression?: Yes(68ml of colostrum by spoon.) Does the patient have breastfeeding experience prior to this delivery?: No  Feeding Feeding Type: (P) Breast Fed  LATCH Score Latch:  Grasps breast easily, tongue down, lips flanged, rhythmical sucking.  Audible Swallowing: A few with stimulation  Type of Nipple: Inverted(Mom has flat and inverted nipples.)  Comfort (Breast/Nipple): Soft / non-tender  Hold (Positioning): Assistance needed to correctly position infant at breast and maintain latch.  LATCH Score: 6  Interventions Interventions: Breast feeding basics reviewed;Breast compression;Adjust position;Hand pump;DEBP;Support pillows;Skin to skin;Assisted with latch;Position options;Breast massage;Hand express;Expressed milk;Pre-pump if needed;Shells  Lactation Tools Discussed/Used Tools: Shells;Nipple Shields Nipple shield size: 16;20 Shell Type: Inverted WIC Program: No Pump Review: Setup, frequency, and cleaning;Milk Storage Initiated by:: by Nurse Date initiated:: 07/24/18   Consult Status Consult Status: Follow-up Date: 07/24/18 Follow-up type: In-patient    Vicente Serene 07/24/2018, 3:37 AM

## 2018-07-24 NOTE — Anesthesia Postprocedure Evaluation (Signed)
Anesthesia Post Note  Patient: Donna Morrison  Procedure(s) Performed: AN AD HOC LABOR EPIDURAL     Patient location during evaluation: Mother Baby Anesthesia Type: Epidural Level of consciousness: awake and alert Pain management: pain level controlled Vital Signs Assessment: post-procedure vital signs reviewed and stable Respiratory status: spontaneous breathing, nonlabored ventilation and respiratory function stable Cardiovascular status: stable Postop Assessment: no headache, no backache and epidural receding Anesthetic complications: no    Last Vitals:  Vitals:   07/24/18 0120 07/24/18 0532  BP: 112/77 126/75  Pulse: 76 80  Resp: 16 16  Temp: 37 C 36.7 C  SpO2: 99% 99%    Last Pain:  Vitals:   07/24/18 0815  TempSrc:   PainSc: 0-No pain   Pain Goal:                   Rayvon Char

## 2018-07-25 LAB — CBC
HCT: 27.3 % — ABNORMAL LOW (ref 36.0–46.0)
Hemoglobin: 8.9 g/dL — ABNORMAL LOW (ref 12.0–15.0)
MCH: 26.1 pg (ref 26.0–34.0)
MCHC: 32.6 g/dL (ref 30.0–36.0)
MCV: 80.1 fL (ref 80.0–100.0)
Platelets: 193 10*3/uL (ref 150–400)
RBC: 3.41 MIL/uL — ABNORMAL LOW (ref 3.87–5.11)
RDW: 15.9 % — ABNORMAL HIGH (ref 11.5–15.5)
WBC: 16.1 10*3/uL — ABNORMAL HIGH (ref 4.0–10.5)
nRBC: 0 % (ref 0.0–0.2)

## 2018-07-25 MED ORDER — IBUPROFEN 600 MG PO TABS: 600.0000 mg | ORAL_TABLET | Freq: Four times a day (QID) | ORAL | 0 refills | Status: DC

## 2018-07-25 MED ORDER — SENNOSIDES-DOCUSATE SODIUM 8.6-50 MG PO TABS: 2.0000 | ORAL_TABLET | ORAL | 1 refills | Status: DC

## 2018-07-25 MED ORDER — ACETAMINOPHEN 325 MG PO TABS: 650.0000 mg | ORAL_TABLET | ORAL | 1 refills | Status: DC | PRN

## 2018-07-25 NOTE — Lactation Note (Signed)
This note was copied from a baby's chart. Lactation Consultation Note  Patient Name: Donna Morrison Today's Date: 07/25/2018  P1, 78 hour female infant, 3% weight loss. Per mom, she no longer wants to latch infant to breast. She plans to formula feed and pump only. Mom will start using DEBP and pump every 3 hours or pump when she offers infant formula to help with breast stimulation and milk induction.  Mom knows to call Nurse or [place] if she has any further breastfeeding  questions or if she decides to latch infant to breast.   Maternal Data    Feeding    LATCH Score                   Interventions    Lactation Tools Discussed/Used     Consult Status      Donna Morrison 07/25/2018, 2:53 AM

## 2018-07-25 NOTE — Discharge Summary (Signed)
OB Discharge Summary  Patient Name: Donna Morrison DOB: Sep 28, 1980 MRN: [id number]  Date of admission: 07/22/2018 Delivering MD: MODY, VAISHALI   Date of discharge: 07/25/2018  Admitting diagnosis: Induction Intrauterine pregnancy: [redacted]w[redacted]d     Secondary diagnosis:Principal Problem:   Postpartum care following vaginal delivery (7/3) Active Problems:   Encounter for planned induction of labor   GDM, class A1   SVD (spontaneous vaginal delivery)  Additional problems: None     Discharge diagnosis: Term Pregnancy Delivered and GDM A1                                                                     Post partum procedures:None  Augmentation: See labor and delivery notes  Complications: None  Hospital course:  Induction of Labor With Vaginal Delivery   38 y.o. yo G1P1001 at [redacted]w[redacted]d was admitted to the hospital 07/22/2018 for induction of labor.  Indication for induction: A1 DM.  Patient had an uncomplicated labor course as follows: Membrane Rupture Time/Date: 1:50 PM ,07/22/2018   Intrapartum Procedures: Episiotomy: Left Mediolateral [3]                                         Lacerations:  2nd degree [3]  Patient had delivery of a Viable infant.  Information for the patient's newborn:  Orvetta, Danielski [[id number]]  Delivery Method: Vaginal, Spontaneous(Filed from Delivery Summary)    07/23/2018  Details of delivery can be found in separate delivery note.  Patient had a routine postpartum course. Patient is discharged home 07/25/18.  On day of discharge patient notes pain controlled with p.o. meds.  Some difficulty breast-feeding.  Appropriate lochia.  Patient notes no dizziness on standing.  Patient notes no chest pain or shortness of breath.  Patient notes resolving edema.  Patient has had a bowel movement.  Physical exam  Vitals:   07/24/18 0532 07/24/18 1509 07/24/18 2210 07/25/18 0600  BP: 126/75 123/68 132/78 130/66  Pulse: 80 85 84 89  Resp: 16 16 16 18   Temp: 98 F  (36.7 C) 97.9 F (36.6 C) 98 F (36.7 C) 98.3 F (36.8 C)  TempSrc: Oral Oral Oral Oral  SpO2: 99% 100%    Weight:      Height:       Cardiovascular: 2 out of 6 systolic flow murmur Lower extremity: No edema General: alert, cooperative and no distress Lochia: appropriate Uterine Fundus: firm Incision: N/A DVT Evaluation: No evidence of DVT seen on physical exam. Labs: Lab Results  Component Value Date   WBC 16.1 (H) 07/24/2018   HGB 8.9 (L) 07/24/2018   HCT 27.3 (L) 07/24/2018   MCV 80.1 07/24/2018   PLT 193 07/24/2018   CMP Latest Ref Rng & Units 07/22/2018  Glucose 70 - 99 mg/dL 88  BUN 6 - 20 mg/dL <5(L)  Creatinine 0.44 - 1.00 mg/dL 0.83  Sodium 135 - 145 mmol/L 138  Potassium 3.5 - 5.1 mmol/L 4.2  Chloride 98 - 111 mmol/L 108  CO2 22 - 32 mmol/L 23  Calcium 8.9 - 10.3 mg/dL 10.1  Total Protein 6.5 - 8.1 g/dL 5.8(L)  Total  Bilirubin 0.3 - 1.2 mg/dL 0.7  Alkaline Phos 38 - 126 U/L 221(H)  AST 15 - 41 U/L 25  ALT 0 - 44 U/L 16    Discharge instruction: per After Visit Summary and "Baby and Me Booklet".  After Visit Meds:  Allergies as of 07/25/2018      Reactions   Shellfish Allergy Swelling      Medication List    TAKE these medications   acetaminophen 325 MG tablet Commonly known as: Tylenol Take 2 tablets (650 mg total) by mouth every 4 (four) hours as needed (for pain scale < 4).   ferrous sulfate 325 (65 FE) MG EC tablet Take 325 mg by mouth 3 (three) times daily with meals.   ibuprofen 600 MG tablet Commonly known as: ADVIL Take 1 tablet (600 mg total) by mouth every 6 (six) hours.   multivitamin-prenatal 27-0.8 MG Tabs tablet Take 1 tablet by mouth daily at 12 noon.   senna-docusate 8.6-50 MG tablet Commonly known as: Senokot-S Take 2 tablets by mouth daily. Start taking on: July 26, 2018       Diet: routine diet  Activity: Advance as tolerated. Pelvic rest for 6 weeks.   Outpatient follow up:6 weeks  Plan 2-hour glucose tolerance  test and repeat evaluation of cardiovascular exam for murmur heard today  Follow up Appt:No future appointments. Follow up visit: No follow-ups on file.  Postpartum contraception: Not Discussed  Newborn Data: Live born female  Birth Weight: 8 lb (3629 g) APGAR: 3, 9  Newborn Delivery   Birth date/time: 07/23/2018 13:09:00 Delivery type: Vaginal, Spontaneous      Baby Feeding: Breast Disposition:home with mother   07/25/2018 Lendon ColonelKelly A Kenesha Moshier, MD

## 2018-07-25 NOTE — Lactation Note (Signed)
This note was copied from a baby's chart. Lactation Consultation Note  Patient Name: Donna Morrison Date: 07/25/2018 Reason for consult: Follow-up assessment;Maternal endocrine disorder;Term;Primapara Type of Endocrine Disorder?: PCOS  LC visited with P74 Mom of term baby on day of discharge.  Mom has decided to formula feed.  DEBP set up at bedside, encouraged her to take all the pump parts home.  Mom has a DEBP from [employer].  She is unsure what she plans to do as far as pumping.   Reviewed engorged treatment for nursing and non-nursing Moms.    Mom aware of OP lactation support available to her.  Encouraged her to call prn.  Interventions Interventions: Breast feeding basics reviewed;DEBP;Ice;Skin to skin;Breast massage;Hand express;Shells    Consult Status Consult Status: Complete Date: 07/25/18 Follow-up type: Call as needed    Donna Morrison 07/25/2018, 10:52 AM

## 2018-07-28 DIAGNOSIS — R42 Dizziness and giddiness: Secondary | ICD-10-CM | POA: Diagnosis not present

## 2018-11-18 ENCOUNTER — Other Ambulatory Visit: Payer: Self-pay

## 2018-11-18 DIAGNOSIS — Z20822 Contact with and (suspected) exposure to covid-19: Secondary | ICD-10-CM

## 2018-11-21 LAB — NOVEL CORONAVIRUS, NAA: SARS-CoV-2, NAA: NOT DETECTED

## 2019-03-29 DIAGNOSIS — J0110 Acute frontal sinusitis, unspecified: Secondary | ICD-10-CM | POA: Diagnosis not present

## 2019-05-25 DIAGNOSIS — Z6834 Body mass index (BMI) 34.0-34.9, adult: Secondary | ICD-10-CM | POA: Diagnosis not present

## 2019-05-25 DIAGNOSIS — Z01419 Encounter for gynecological examination (general) (routine) without abnormal findings: Secondary | ICD-10-CM | POA: Diagnosis not present

## 2019-06-21 DIAGNOSIS — Z13220 Encounter for screening for lipoid disorders: Secondary | ICD-10-CM | POA: Diagnosis not present

## 2019-06-21 DIAGNOSIS — Z0000 Encounter for general adult medical examination without abnormal findings: Secondary | ICD-10-CM | POA: Diagnosis not present

## 2019-06-21 DIAGNOSIS — Z130 Encounter for screening for diseases of the blood and blood-forming organs and certain disorders involving the immune mechanism: Secondary | ICD-10-CM | POA: Diagnosis not present

## 2019-06-21 DIAGNOSIS — Z1329 Encounter for screening for other suspected endocrine disorder: Secondary | ICD-10-CM | POA: Diagnosis not present

## 2019-06-21 DIAGNOSIS — Z8632 Personal history of gestational diabetes: Secondary | ICD-10-CM | POA: Diagnosis not present

## 2019-06-29 DIAGNOSIS — J069 Acute upper respiratory infection, unspecified: Secondary | ICD-10-CM | POA: Diagnosis not present

## 2019-07-25 DIAGNOSIS — F411 Generalized anxiety disorder: Secondary | ICD-10-CM | POA: Diagnosis not present

## 2019-07-25 DIAGNOSIS — F332 Major depressive disorder, recurrent severe without psychotic features: Secondary | ICD-10-CM | POA: Diagnosis not present

## 2019-08-04 DIAGNOSIS — F332 Major depressive disorder, recurrent severe without psychotic features: Secondary | ICD-10-CM | POA: Diagnosis not present

## 2019-08-04 DIAGNOSIS — F411 Generalized anxiety disorder: Secondary | ICD-10-CM | POA: Diagnosis not present

## 2019-08-08 DIAGNOSIS — F332 Major depressive disorder, recurrent severe without psychotic features: Secondary | ICD-10-CM | POA: Diagnosis not present

## 2019-08-08 DIAGNOSIS — F411 Generalized anxiety disorder: Secondary | ICD-10-CM | POA: Diagnosis not present

## 2019-08-29 DIAGNOSIS — F332 Major depressive disorder, recurrent severe without psychotic features: Secondary | ICD-10-CM | POA: Diagnosis not present

## 2019-08-29 DIAGNOSIS — F411 Generalized anxiety disorder: Secondary | ICD-10-CM | POA: Diagnosis not present

## 2019-09-05 DIAGNOSIS — F411 Generalized anxiety disorder: Secondary | ICD-10-CM | POA: Diagnosis not present

## 2019-09-05 DIAGNOSIS — F332 Major depressive disorder, recurrent severe without psychotic features: Secondary | ICD-10-CM | POA: Diagnosis not present

## 2019-09-14 DIAGNOSIS — F411 Generalized anxiety disorder: Secondary | ICD-10-CM | POA: Diagnosis not present

## 2019-09-14 DIAGNOSIS — F332 Major depressive disorder, recurrent severe without psychotic features: Secondary | ICD-10-CM | POA: Diagnosis not present

## 2019-10-03 DIAGNOSIS — F332 Major depressive disorder, recurrent severe without psychotic features: Secondary | ICD-10-CM | POA: Diagnosis not present

## 2019-10-03 DIAGNOSIS — F411 Generalized anxiety disorder: Secondary | ICD-10-CM | POA: Diagnosis not present

## 2019-10-31 DIAGNOSIS — F411 Generalized anxiety disorder: Secondary | ICD-10-CM | POA: Diagnosis not present

## 2019-10-31 DIAGNOSIS — F332 Major depressive disorder, recurrent severe without psychotic features: Secondary | ICD-10-CM | POA: Diagnosis not present

## 2019-12-12 DIAGNOSIS — F332 Major depressive disorder, recurrent severe without psychotic features: Secondary | ICD-10-CM | POA: Diagnosis not present

## 2019-12-12 DIAGNOSIS — F411 Generalized anxiety disorder: Secondary | ICD-10-CM | POA: Diagnosis not present

## 2019-12-13 DIAGNOSIS — Z6834 Body mass index (BMI) 34.0-34.9, adult: Secondary | ICD-10-CM | POA: Diagnosis not present

## 2019-12-13 DIAGNOSIS — R5383 Other fatigue: Secondary | ICD-10-CM | POA: Diagnosis not present

## 2019-12-13 DIAGNOSIS — E282 Polycystic ovarian syndrome: Secondary | ICD-10-CM | POA: Diagnosis not present

## 2020-01-03 DIAGNOSIS — F411 Generalized anxiety disorder: Secondary | ICD-10-CM | POA: Diagnosis not present

## 2020-01-03 DIAGNOSIS — F332 Major depressive disorder, recurrent severe without psychotic features: Secondary | ICD-10-CM | POA: Diagnosis not present

## 2020-07-24 DIAGNOSIS — Z20822 Contact with and (suspected) exposure to covid-19: Secondary | ICD-10-CM | POA: Diagnosis not present

## 2020-09-17 DIAGNOSIS — F411 Generalized anxiety disorder: Secondary | ICD-10-CM | POA: Diagnosis not present

## 2020-09-17 DIAGNOSIS — F332 Major depressive disorder, recurrent severe without psychotic features: Secondary | ICD-10-CM | POA: Diagnosis not present

## 2020-10-15 DIAGNOSIS — F411 Generalized anxiety disorder: Secondary | ICD-10-CM | POA: Diagnosis not present

## 2020-10-15 DIAGNOSIS — F332 Major depressive disorder, recurrent severe without psychotic features: Secondary | ICD-10-CM | POA: Diagnosis not present

## 2020-11-12 DIAGNOSIS — Z20822 Contact with and (suspected) exposure to covid-19: Secondary | ICD-10-CM | POA: Diagnosis not present

## 2020-11-13 DIAGNOSIS — F411 Generalized anxiety disorder: Secondary | ICD-10-CM | POA: Diagnosis not present

## 2020-11-13 DIAGNOSIS — F332 Major depressive disorder, recurrent severe without psychotic features: Secondary | ICD-10-CM | POA: Diagnosis not present

## 2021-02-02 ENCOUNTER — Emergency Department (HOSPITAL_COMMUNITY)
Admission: EM | Admit: 2021-02-02 | Discharge: 2021-02-02 | Disposition: A | Discharge status: Home / Self Care | Payer: BC Managed Care – PPO | Attending: Emergency Medicine | Admitting: Emergency Medicine

## 2021-02-02 ENCOUNTER — Encounter (HOSPITAL_COMMUNITY): Payer: Self-pay

## 2021-02-02 ENCOUNTER — Other Ambulatory Visit: Payer: Self-pay

## 2021-02-02 ENCOUNTER — Emergency Department (HOSPITAL_COMMUNITY): Payer: BC Managed Care – PPO

## 2021-02-02 ENCOUNTER — Encounter (HOSPITAL_COMMUNITY): Payer: Self-pay | Admitting: Emergency Medicine

## 2021-02-02 ENCOUNTER — Ambulatory Visit (HOSPITAL_COMMUNITY)
Admission: EM | Admit: 2021-02-02 | Discharge: 2021-02-02 | Disposition: A | Discharge status: Home / Self Care | Payer: BC Managed Care – PPO

## 2021-02-02 DIAGNOSIS — R200 Anesthesia of skin: Secondary | ICD-10-CM

## 2021-02-02 DIAGNOSIS — R202 Paresthesia of skin: Secondary | ICD-10-CM | POA: Diagnosis not present

## 2021-02-02 DIAGNOSIS — R519 Headache, unspecified: Secondary | ICD-10-CM | POA: Diagnosis not present

## 2021-02-02 DIAGNOSIS — R29818 Other symptoms and signs involving the nervous system: Secondary | ICD-10-CM | POA: Diagnosis not present

## 2021-02-02 LAB — DIFFERENTIAL
Abs Immature Granulocytes: 0.01 10*3/uL (ref 0.00–0.07)
Basophils Absolute: 0.1 10*3/uL (ref 0.0–0.1)
Basophils Relative: 2 %
Eosinophils Absolute: 0.1 10*3/uL (ref 0.0–0.5)
Eosinophils Relative: 2 %
Immature Granulocytes: 0 %
Lymphocytes Relative: 43 %
Lymphs Abs: 2.3 10*3/uL (ref 0.7–4.0)
Monocytes Absolute: 0.5 10*3/uL (ref 0.1–1.0)
Monocytes Relative: 9 %
Neutro Abs: 2.4 10*3/uL (ref 1.7–7.7)
Neutrophils Relative %: 44 %

## 2021-02-02 LAB — CBC
HCT: 35.7 % — ABNORMAL LOW (ref 36.0–46.0)
Hemoglobin: 11.4 g/dL — ABNORMAL LOW (ref 12.0–15.0)
MCH: 25.1 pg — ABNORMAL LOW (ref 26.0–34.0)
MCHC: 31.9 g/dL (ref 30.0–36.0)
MCV: 78.5 fL — ABNORMAL LOW (ref 80.0–100.0)
Platelets: 393 10*3/uL (ref 150–400)
RBC: 4.55 MIL/uL (ref 3.87–5.11)
RDW: 15.6 % — ABNORMAL HIGH (ref 11.5–15.5)
WBC: 5.3 10*3/uL (ref 4.0–10.5)
nRBC: 0 % (ref 0.0–0.2)

## 2021-02-02 LAB — I-STAT CHEM 8, ED
BUN: 10 mg/dL (ref 6–20)
Calcium, Ion: 1.43 mmol/L — ABNORMAL HIGH (ref 1.15–1.40)
Chloride: 106 mmol/L (ref 98–111)
Creatinine, Ser: 0.9 mg/dL (ref 0.44–1.00)
Glucose, Bld: 94 mg/dL (ref 70–99)
HCT: 37 % (ref 36.0–46.0)
Hemoglobin: 12.6 g/dL (ref 12.0–15.0)
Potassium: 3.9 mmol/L (ref 3.5–5.1)
Sodium: 140 mmol/L (ref 135–145)
TCO2: 26 mmol/L (ref 22–32)

## 2021-02-02 LAB — PROTIME-INR
INR: 0.9 (ref 0.8–1.2)
Prothrombin Time: 12.4 seconds (ref 11.4–15.2)

## 2021-02-02 LAB — COMPREHENSIVE METABOLIC PANEL
ALT: 14 U/L (ref 0–44)
AST: 18 U/L (ref 15–41)
Albumin: 3.7 g/dL (ref 3.5–5.0)
Alkaline Phosphatase: 70 U/L (ref 38–126)
Anion gap: 7 (ref 5–15)
BUN: 9 mg/dL (ref 6–20)
CO2: 25 mmol/L (ref 22–32)
Calcium: 10.4 mg/dL — ABNORMAL HIGH (ref 8.9–10.3)
Chloride: 106 mmol/L (ref 98–111)
Creatinine, Ser: 0.97 mg/dL (ref 0.44–1.00)
GFR, Estimated: >60 mL/min (ref >60)
Glucose, Bld: 101 mg/dL — ABNORMAL HIGH (ref 70–99)
Potassium: 3.9 mmol/L (ref 3.5–5.1)
Sodium: 138 mmol/L (ref 135–145)
Total Bilirubin: 0.6 mg/dL (ref 0.3–1.2)
Total Protein: 6.8 g/dL (ref 6.5–8.1)

## 2021-02-02 LAB — I-STAT BETA HCG BLOOD, ED (MC, WL, AP ONLY): I-stat hCG, quantitative: <5 m[IU]/mL (ref <5)

## 2021-02-02 LAB — APTT: aPTT: 28 seconds (ref 24–36)

## 2021-02-02 MED ORDER — SODIUM CHLORIDE 0.9% FLUSH: 3.0000 mL | Freq: Once | INTRAVENOUS | Status: DC

## 2021-02-02 NOTE — ED Provider Notes (Signed)
[facility] Emergency Department Provider Note MRN:  [id number]  Arrival date & time: 02/02/21     Chief Complaint   Numbness   History of Present Illness   Donna Morrison is a 41 y.o. year-old female presents to the ED with chief complaint of numbness and tingling in left upper and left lower extremities.  She states that the symptoms started 2 days ago.  She states she is also had a headache.  She reports history of prior stroke.  She is not on any medications for reported previous stroke.  She denies any slurred speech or weakness..    Review of Systems  Pertinent review of systems noted in HPI.    Physical Exam   Vitals:   02/02/21 2024 02/02/21 2206  BP: 128/67 112/68  Pulse: (!) 58 64  Resp: 18 17  Temp:    SpO2: 100% 100%    CONSTITUTIONAL:  well-appearing, NAD NEURO:  Alert and oriented x 3, CN 3-12 grossly intact EYES:  eyes equal and reactive ENT/NECK:  Supple, no stridor  CARDIO:  normal rate, regular rhythm, appears well-perfused  PULM:  No respiratory distress,  GI/GU:  non-distended,  MSK/SPINE:  No gross deformities, no edema, moves all extremities, no strength deficits, tingling in left arm improved when hand is placed on head SKIN:  no rash, atraumatic   *Additional and/or pertinent findings included in MDM below  Diagnostic and Interventional Summary    EKG Interpretation  Date/Time:    Ventricular Rate:    PR Interval:    QRS Duration:   QT Interval:    QTC Calculation:   R Axis:     Text Interpretation:         Labs Reviewed  CBC - Abnormal; Notable for the following components:      Result Value   Hemoglobin 11.4 (*)    HCT 35.7 (*)    MCV 78.5 (*)    MCH 25.1 (*)    RDW 15.6 (*)    All other components within normal limits  COMPREHENSIVE METABOLIC PANEL - Abnormal; Notable for the following components:   Glucose, Bld 101 (*)    Calcium 10.4 (*)    All other components within normal limits  I-STAT  CHEM 8, ED - Abnormal; Notable for the following components:   Calcium, Ion 1.43 (*)    All other components within normal limits  PROTIME-INR  APTT  DIFFERENTIAL  I-STAT BETA HCG BLOOD, ED ([facility], [facility], [facility] ONLY)    CT HEAD WO CONTRAST  Final Result      Medications  sodium chloride flush (NS) 0.9 % injection 3 mL (has no administration in time range)     Procedures  /  Critical Care Procedures  ED Course and Medical Decision Making  I have reviewed the triage vital signs, the nursing notes, and pertinent available records from the EMR.  Complexity of Problems Addressed Acute illness or injury that poses threat of life of bodily function  Additional Data Reviewed and Analyzed Further history obtained from: Past medical history and medications listed in the EMR and Prior ED visit notes    ED Course    Patient here with numbness and tingling in left upper and lower extremities.  Onset was 2 days ago.  She had labs and CT ordered in triage, which are all negative.  However, given patient's prior stroke, I am concerned that this could be a possible etiology.  This could be simply a pinched  nerve in her neck, but I have recommended the patient that she get an MRI.  I personally reviewed the labs, CT, and EKG.  Patient states that she cannot wait for an MRI.  She is requesting discharge.  Patient has capacity make her own medical decisions.  She understands that by being discharged at this time, she places her self at risk for new or worsening symptoms, increased morbidity and/or mortality.  I have placed an ambulatory referral to neurology, and given her neurology follow-up.  Return precautions are discussed.  She is discharged AGAINST MEDICAL ADVICE.     Final Clinical Impressions(s) / ED Diagnoses     ICD-10-CM   1. Paresthesia  R20.2       ED Discharge Orders          Ordered    Ambulatory referral to Neurology       Comments: An appointment is requested in  approximately: 1 week   02/02/21 2331             Discharge Instructions Discussed with and Provided to Patient:   Discharge Instructions   None      Roxy Horseman, PA-C 02/03/21 0040    Wilkie Aye Mayer Masker, MD 02/03/21 [phone]

## 2021-02-02 NOTE — ED Triage Notes (Signed)
Reports constant L sided arm and leg numbness and tingling x 2 days.  Denies facial symptoms.  States it starts in L side of neck and goes down to L foot.  Reports headache that started just PTA.  Denies weakness. No arm drift.  Pt ambulatory to triage without difficulty.

## 2021-02-02 NOTE — ED Provider Notes (Signed)
[place]    CSN: [id number] Arrival date & time: 02/02/21  1751      History   Chief Complaint Chief Complaint  Patient presents with   Tingling    HPI Donna Morrison is a 41 y.o. female.   Pt presents with complaints of intermittent numbness and tingling to the right side of her body that started two days ago.  She also complains of headache.  She reports she has experienced similar sx in the past and was told she had a minor stroke.  Denies slurred speech, weakness, chest pain, shortness of breath, fever, chills.    Past Medical History:  Diagnosis Date   BV (bacterial vaginosis)    Gestational diabetes    Hirsutism    PCOS (polycystic ovarian syndrome)     Patient Active Problem List   Diagnosis Date Noted   Postpartum care following vaginal delivery (7/3) 07/24/2018   GDM, class A1 07/23/2018   SVD (spontaneous vaginal delivery) 07/23/2018   Encounter for planned induction of labor 07/22/2018    No past surgical history on file.  OB History     Gravida  1   Para  1   Term  1   Preterm      AB      Living  1      SAB      IAB      Ectopic      Multiple  0   Live Births  1            Home Medications    Prior to Admission medications   Medication Sig Start Date End Date Taking? Authorizing Provider  acetaminophen (TYLENOL) 325 MG tablet Take 2 tablets (650 mg total) by mouth every 4 (four) hours as needed (for pain scale < 4). 07/25/18   Aloha Gell, MD  ferrous sulfate 325 (65 FE) MG EC tablet Take 325 mg by mouth 3 (three) times daily with meals.    [provider]  ibuprofen (ADVIL) 600 MG tablet Take 1 tablet (600 mg total) by mouth every 6 (six) hours. 07/25/18   Aloha Gell, MD  Prenatal Vit-Fe Fumarate-FA (MULTIVITAMIN-PRENATAL) 27-0.8 MG TABS tablet Take 1 tablet by mouth daily at 12 noon.    [provider]  senna-docusate (SENOKOT-S) 8.6-50 MG tablet Take 2 tablets by mouth daily. 07/26/18    Aloha Gell, MD    Family History No family history on file.  Social History Social History   Tobacco Use   Smoking status: Never   Smokeless tobacco: Never  Vaping Use   Vaping Use: Never used  Substance Use Topics   Alcohol use: No   Drug use: No     Allergies   Shellfish allergy   Review of Systems Review of Systems  Constitutional:  Negative for chills and fever.  HENT:  Negative for ear pain and sore throat.   Eyes:  Negative for pain and visual disturbance.  Respiratory:  Negative for cough and shortness of breath.   Cardiovascular:  Negative for chest pain and palpitations.  Gastrointestinal:  Negative for abdominal pain and vomiting.  Genitourinary:  Negative for dysuria and hematuria.  Musculoskeletal:  Negative for arthralgias and back pain.  Skin:  Negative for color change and rash.  Neurological:  Positive for numbness and headaches. Negative for seizures and syncope.  All other systems reviewed and are negative.   Physical Exam Triage Vital Signs ED Triage Vitals  Enc Vitals Group  BP 02/02/21 1827 135/85     Pulse Rate 02/02/21 1827 68     Resp 02/02/21 1827 18     Temp 02/02/21 1827 98.9 F (37.2 C)     Temp Source 02/02/21 1827 Oral     SpO2 02/02/21 1827 93 %     Weight --      Height --      Head Circumference --      Peak Flow --      Pain Score 02/02/21 1828 0     Pain Loc --      Pain Edu? --      Excl. in [place]? --    No data found.  Updated Vital Signs BP 135/85 (BP Location: Right Arm)    Pulse 68    Temp 98.9 F (37.2 C) (Oral)    Resp 18    SpO2 93%   Visual Acuity Right Eye Distance:   Left Eye Distance:   Bilateral Distance:    Right Eye Near:   Left Eye Near:    Bilateral Near:     Physical Exam Vitals and nursing note reviewed.  Constitutional:      General: She is not in acute distress.    Appearance: She is well-developed.  HENT:     Head: Normocephalic and atraumatic.  Eyes:      Conjunctiva/sclera: Conjunctivae normal.  Cardiovascular:     Rate and Rhythm: Normal rate and regular rhythm.     Heart sounds: No murmur heard. Pulmonary:     Effort: Pulmonary effort is normal. No respiratory distress.     Breath sounds: Normal breath sounds.  Abdominal:     Palpations: Abdomen is soft.     Tenderness: There is no abdominal tenderness.  Musculoskeletal:        General: No swelling.     Cervical back: Neck supple.  Skin:    General: Skin is warm and dry.     Capillary Refill: Capillary refill takes less than 2 seconds.  Neurological:     Mental Status: She is alert.  Psychiatric:        Mood and Affect: Mood normal.     UC Treatments / Results  Labs (all labs ordered are listed, but only abnormal results are displayed) Labs Reviewed - No data to display  EKG   Radiology No results found.  Procedures Procedures (including critical care time)  Medications Ordered in UC Medications - No data to display  Initial Impression / Assessment and Plan / UC Course  I have reviewed the triage vital signs and the nursing notes.  Pertinent labs & imaging results that were available during my care of the patient were reviewed by me and considered in my medical decision making (see chart for details).     Due to history of possible stroke advised further evaluation of left sided numbness and tingling in the ED. Pt understands and agrees with plan.  Final Clinical Impressions(s) / UC Diagnoses   Final diagnoses:  Numbness and tingling     Discharge Instructions      Advised follow up in ED for left sided numbness and tingling with h/o possible stroke in the past   ED Prescriptions   None    PDMP not reviewed this encounter.   Ward, Lenise Arena, PA-C 02/02/21 [phone]

## 2021-02-02 NOTE — Discharge Instructions (Signed)
Advised follow up in ED for left sided numbness and tingling with h/o possible stroke in the past

## 2021-02-02 NOTE — ED Triage Notes (Signed)
Pt states that she has had intermittent left side numbness/tingling. Jessica aware.

## 2021-02-02 NOTE — ED Notes (Signed)
Pt has been advised to go directly to the ED for evaluation of stroke sx. Pt verbalized understanding and states she will go directley to the ED.

## 2021-06-14 DIAGNOSIS — Z6834 Body mass index (BMI) 34.0-34.9, adult: Secondary | ICD-10-CM | POA: Diagnosis not present

## 2021-06-14 DIAGNOSIS — Z01419 Encounter for gynecological examination (general) (routine) without abnormal findings: Secondary | ICD-10-CM | POA: Diagnosis not present

## 2021-06-14 DIAGNOSIS — Z124 Encounter for screening for malignant neoplasm of cervix: Secondary | ICD-10-CM | POA: Diagnosis not present

## 2021-09-15 DIAGNOSIS — R42 Dizziness and giddiness: Secondary | ICD-10-CM | POA: Diagnosis not present

## 2021-09-26 DIAGNOSIS — E119 Type 2 diabetes mellitus without complications: Secondary | ICD-10-CM | POA: Diagnosis not present

## 2021-10-24 DIAGNOSIS — Z131 Encounter for screening for diabetes mellitus: Secondary | ICD-10-CM | POA: Diagnosis not present

## 2021-10-24 DIAGNOSIS — Z1231 Encounter for screening mammogram for malignant neoplasm of breast: Secondary | ICD-10-CM | POA: Diagnosis not present

## 2021-10-24 DIAGNOSIS — Z319 Encounter for procreative management, unspecified: Secondary | ICD-10-CM | POA: Diagnosis not present

## 2021-10-24 DIAGNOSIS — Z13220 Encounter for screening for lipoid disorders: Secondary | ICD-10-CM | POA: Diagnosis not present

## 2021-10-24 DIAGNOSIS — Z0000 Encounter for general adult medical examination without abnormal findings: Secondary | ICD-10-CM | POA: Diagnosis not present

## 2021-10-24 DIAGNOSIS — E282 Polycystic ovarian syndrome: Secondary | ICD-10-CM | POA: Diagnosis not present

## 2021-10-24 DIAGNOSIS — Z3149 Encounter for other procreative investigation and testing: Secondary | ICD-10-CM | POA: Diagnosis not present

## 2021-10-24 DIAGNOSIS — R634 Abnormal weight loss: Secondary | ICD-10-CM | POA: Diagnosis not present

## 2021-10-24 DIAGNOSIS — Z6834 Body mass index (BMI) 34.0-34.9, adult: Secondary | ICD-10-CM | POA: Diagnosis not present

## 2021-10-24 DIAGNOSIS — E119 Type 2 diabetes mellitus without complications: Secondary | ICD-10-CM | POA: Diagnosis not present

## 2021-10-24 DIAGNOSIS — Z1329 Encounter for screening for other suspected endocrine disorder: Secondary | ICD-10-CM | POA: Diagnosis not present

## 2022-01-16 DIAGNOSIS — Z3490 Encounter for supervision of normal pregnancy, unspecified, unspecified trimester: Secondary | ICD-10-CM | POA: Diagnosis not present

## 2022-01-16 DIAGNOSIS — Z3689 Encounter for other specified antenatal screening: Secondary | ICD-10-CM | POA: Diagnosis not present

## 2022-01-16 DIAGNOSIS — Z3200 Encounter for pregnancy test, result unknown: Secondary | ICD-10-CM | POA: Diagnosis not present

## 2022-01-20 NOTE — L&D Delivery Note (Signed)
Delivery Note At 2:14 AM a viable and healthy female was delivered via Vaginal, Spontaneous (Presentation: Left Occiput Anterior).  APGAR: 8, 9; weight  . pending  Placenta status: Spontaneous, Intact.  Cord: 3 vessels with the following complications: None.  Cord pH: NA  Anesthesia: Epidural Episiotomy: None Lacerations: Vaginal;1st degree;Perineal;Labial Suture Repair: 3.0 vicryl rapide Est. Blood Loss (mL): 250  Mom to postpartum.  Baby to Couplet care / Skin to Skin.  Robley Fries 09/19/2022, 3:03 AM

## 2022-01-21 DIAGNOSIS — Z3490 Encounter for supervision of normal pregnancy, unspecified, unspecified trimester: Secondary | ICD-10-CM | POA: Diagnosis not present

## 2022-01-30 DIAGNOSIS — Z3201 Encounter for pregnancy test, result positive: Secondary | ICD-10-CM | POA: Diagnosis not present

## 2022-02-27 ENCOUNTER — Ambulatory Visit: Payer: BC Managed Care – PPO | Admitting: Family Medicine

## 2022-02-27 ENCOUNTER — Encounter: Payer: Self-pay | Admitting: Family Medicine

## 2022-02-27 VITALS — BP 112/80 | HR 80 | Temp 98.7°F | Ht 65.35 in | Wt 209.0 lb

## 2022-02-27 DIAGNOSIS — O09521 Supervision of elderly multigravida, first trimester: Secondary | ICD-10-CM | POA: Diagnosis not present

## 2022-02-27 DIAGNOSIS — O99211 Obesity complicating pregnancy, first trimester: Secondary | ICD-10-CM | POA: Diagnosis not present

## 2022-02-27 DIAGNOSIS — Z7689 Persons encountering health services in other specified circumstances: Secondary | ICD-10-CM | POA: Diagnosis not present

## 2022-02-27 DIAGNOSIS — O210 Mild hyperemesis gravidarum: Secondary | ICD-10-CM

## 2022-02-27 DIAGNOSIS — Z3201 Encounter for pregnancy test, result positive: Secondary | ICD-10-CM | POA: Diagnosis not present

## 2022-02-27 DIAGNOSIS — Z3689 Encounter for other specified antenatal screening: Secondary | ICD-10-CM | POA: Diagnosis not present

## 2022-02-27 DIAGNOSIS — Z8632 Personal history of gestational diabetes: Secondary | ICD-10-CM | POA: Diagnosis not present

## 2022-02-27 DIAGNOSIS — R7303 Prediabetes: Secondary | ICD-10-CM | POA: Diagnosis not present

## 2022-02-27 DIAGNOSIS — R12 Heartburn: Secondary | ICD-10-CM

## 2022-02-27 DIAGNOSIS — Z3A10 10 weeks gestation of pregnancy: Secondary | ICD-10-CM | POA: Diagnosis not present

## 2022-02-27 LAB — OB RESULTS CONSOLE GC/CHLAMYDIA
Chlamydia: NEGATIVE
Neisseria Gonorrhea: NEGATIVE

## 2022-02-27 LAB — HEPATITIS C ANTIBODY: HCV Ab: NEGATIVE

## 2022-02-27 LAB — OB RESULTS CONSOLE HEPATITIS B SURFACE ANTIGEN: Hepatitis B Surface Ag: NEGATIVE

## 2022-02-27 LAB — OB RESULTS CONSOLE HIV ANTIBODY (ROUTINE TESTING): HIV: NONREACTIVE

## 2022-02-27 LAB — OB RESULTS CONSOLE RPR: RPR: NONREACTIVE

## 2022-02-27 LAB — OB RESULTS CONSOLE RUBELLA ANTIBODY, IGM: Rubella: IMMUNE

## 2022-02-27 NOTE — Progress Notes (Signed)
   Established Patient Office Visit   Subjective  Patient ID: Donna Morrison, female    DOB: August 24, 1980  Age: 42 y.o. MRN: [id number]  Chief Complaint  Patient presents with   New Patient (Initial Visit)    Pt is a 42 yo G2P1 at 10 wks who presents to establish care.    Pregnancy:  Followed by Dr. Azucena Fallen, OB/Gyn.  Had 6 wk u/s in Jan.  Unsure of LMP.  Endorses increased n/v.  Taking PNV at night.  At times gets the sensation of a bubble in throat when drinking or swallowing.  Having some heart burn.  Not currently on other meds.  Family med hx:  Dad-HTN, HLD, DM      ROS Negative unless stated above    Objective:     BP 112/80 (BP Location: Left Arm, Patient Position: Sitting, Cuff Size: Large)   Pulse 80   Temp 98.7 F (37.1 C) (Oral)   Ht 5' 5.35" (1.66 m)   Wt 209 lb (94.8 kg)   SpO2 100%   BMI 34.40 kg/m    Physical Exam Constitutional:      General: She is not in acute distress.    Appearance: Normal appearance.  HENT:     Head: Normocephalic and atraumatic.     Nose: Nose normal.     Mouth/Throat:     Mouth: Mucous membranes are moist.  Eyes:     Extraocular Movements: Extraocular movements intact.     Conjunctiva/sclera: Conjunctivae normal.     Pupils: Pupils are equal, round, and reactive to light.  Cardiovascular:     Rate and Rhythm: Normal rate and regular rhythm.     Heart sounds: Normal heart sounds. No murmur heard.    No gallop.  Pulmonary:     Effort: Pulmonary effort is normal. No respiratory distress.     Breath sounds: Normal breath sounds. No wheezing, rhonchi or rales.  Abdominal:     General: Bowel sounds are normal.     Palpations: Abdomen is soft.     Comments: gravid  Skin:    General: Skin is warm and dry.  Neurological:     Mental Status: She is alert and oriented to person, place, and time.      No results found for any visits on 02/27/22.    Assessment & Plan:  [redacted] weeks gestation of pregnancy -G2P1 at 10  wks based on 6 wk u/s.  Unsure of last LMP. -continue PNV -continue f/u with OB/Gyn  Encounter to establish care -We reviewed the PMH, PSH, FH, SH, Meds and Allergies. -We provided refills for any medications we will prescribe as needed. -We addressed current concerns per orders and patient instructions. -We have asked for records for pertinent exams, studies, vaccines and notes from previous providers. -We have advised patient to follow up per instructions below.  Prediabetes -A1C 6.1% on 06/21/19 -diet modifications encouraged  Morning sickness -OTC vitamin B6 and half tab unisom -eating smaller portions, hydration  Heart burn -ok to use OTC Tums -eating smaller portions, etc.   Return if symptoms worsen or fail to improve.   Billie Ruddy, MD

## 2022-03-14 ENCOUNTER — Emergency Department (HOSPITAL_COMMUNITY)
Admission: EM | Admit: 2022-03-14 | Discharge: 2022-03-14 | Disposition: A | Discharge status: Home / Self Care | Payer: BC Managed Care – PPO | Attending: Emergency Medicine | Admitting: Emergency Medicine

## 2022-03-14 ENCOUNTER — Other Ambulatory Visit: Payer: Self-pay

## 2022-03-14 DIAGNOSIS — O26891 Other specified pregnancy related conditions, first trimester: Secondary | ICD-10-CM | POA: Insufficient documentation

## 2022-03-14 DIAGNOSIS — K5641 Fecal impaction: Secondary | ICD-10-CM

## 2022-03-14 DIAGNOSIS — O99611 Diseases of the digestive system complicating pregnancy, first trimester: Secondary | ICD-10-CM | POA: Insufficient documentation

## 2022-03-14 DIAGNOSIS — K5900 Constipation, unspecified: Secondary | ICD-10-CM

## 2022-03-14 DIAGNOSIS — R1084 Generalized abdominal pain: Secondary | ICD-10-CM | POA: Diagnosis not present

## 2022-03-14 DIAGNOSIS — R339 Retention of urine, unspecified: Secondary | ICD-10-CM | POA: Diagnosis not present

## 2022-03-14 DIAGNOSIS — O99891 Other specified diseases and conditions complicating pregnancy: Secondary | ICD-10-CM | POA: Diagnosis not present

## 2022-03-14 LAB — URINALYSIS, ROUTINE W REFLEX MICROSCOPIC
Bilirubin Urine: NEGATIVE
Glucose, UA: NEGATIVE mg/dL
Hgb urine dipstick: NEGATIVE
Ketones, ur: NEGATIVE mg/dL
Leukocytes,Ua: NEGATIVE
Nitrite: NEGATIVE
Protein, ur: NEGATIVE mg/dL
Specific Gravity, Urine: 1.006 (ref 1.005–1.030)
pH: 6 (ref 5.0–8.0)

## 2022-03-14 MED ORDER — FLEET ENEMA 7-19 GM/118ML RE ENEM
1.0000 | ENEMA | Freq: Once | RECTAL | Status: AC
  Administered 2022-03-14: 1 via RECTAL
  Filled 2022-03-14: qty 1

## 2022-03-14 MED ORDER — POLYETHYLENE GLYCOL 3350 17 G PO PACK: 17.0000 g | PACK | Freq: Every day | ORAL | 0 refills | Status: DC

## 2022-03-14 NOTE — Discharge Instructions (Addendum)
Take the stool softeners daily to prevent recurrent constipation.  Follow-up with your OB/GYN doctor or urologist to have the Foley catheter removed in a few days.  Return to the ER for fevers vaginal bleeding worsening symptoms

## 2022-03-14 NOTE — ED Notes (Signed)
Scanned patient and got a volume of 917 and 933ms

## 2022-03-14 NOTE — ED Provider Notes (Signed)
[place] Provider Note   CSN: [id number] Arrival date & time: 03/14/22  [id number]     History  Chief Complaint  Patient presents with   Urinary Retention    Donna Morrison is a 42 y.o. female.  HPI   Pt has history of PCOS, asthma, BV and is at [redacted] weeks EGA.  Pt presents with complaints of inability to empty her bladder and an urge to have a bowel movement.  Patient states she has been having normal bowel movements and had been urinating normally until last night.  Patient states since then she has not been able to urinate.  Patient also feels now that she has to have a bowel movement but nothing will pass.  Patient denies having any vaginal bleeding.  No vaginal discharge.  Patient states she has had some urinary symptoms like this in the past but it resolved on its own.  She has not had any fevers or chills.  No abdominal pain.  Home Medications Prior to Admission medications   Medication Sig Start Date End Date Taking? Authorizing Provider  polyethylene glycol (MIRALAX) 17 g packet Take 17 g by mouth daily. 03/14/22  Yes Dorie Rank, MD      Allergies    Shellfish allergy    Review of Systems   Review of Systems  Physical Exam Updated Vital Signs BP (!) 147/62 (BP Location: Right Arm)   Pulse 84   Temp 98.1 F (36.7 C) (Oral)   Resp 18   Ht 1.651 m ('5\' 5"'$ )   Wt 94.8 kg   SpO2 100%   BMI 34.78 kg/m  Physical Exam Vitals and nursing note reviewed.  Constitutional:      Appearance: She is well-developed.     Comments: Appears uncomfortable and in pain  HENT:     Head: Normocephalic and atraumatic.     Right Ear: External ear normal.     Left Ear: External ear normal.  Eyes:     General: No scleral icterus.       Right eye: No discharge.        Left eye: No discharge.     Conjunctiva/sclera: Conjunctivae normal.  Neck:     Trachea: No tracheal deviation.  Cardiovascular:     Rate and Rhythm: Normal rate and  regular rhythm.  Pulmonary:     Effort: Pulmonary effort is normal. No respiratory distress.     Breath sounds: Normal breath sounds. No stridor. No wheezing or rales.  Abdominal:     General: Bowel sounds are normal. There is no distension.     Palpations: Abdomen is soft.     Tenderness: There is abdominal tenderness. There is no guarding or rebound.     Comments: Fullness suprapubic region, tenderness palpation  Genitourinary:    Comments: Stool palpated in the proximal rectum Musculoskeletal:        General: No tenderness or deformity.     Cervical back: Neck supple.  Skin:    General: Skin is warm and dry.     Findings: No rash.  Neurological:     General: No focal deficit present.     Mental Status: She is alert.     Cranial Nerves: No cranial nerve deficit, dysarthria or facial asymmetry.     Sensory: No sensory deficit.     Motor: No abnormal muscle tone or seizure activity.     Coordination: Coordination normal.  Psychiatric:  Mood and Affect: Mood normal.     ED Results / Procedures / Treatments   Labs (all labs ordered are listed, but only abnormal results are displayed) Labs Reviewed  URINALYSIS, ROUTINE W REFLEX MICROSCOPIC - Abnormal; Notable for the following components:      Result Value   Color, Urine STRAW (*)    All other components within normal limits    EKG None  Radiology No results found.  Procedures Procedures    Medications Ordered in ED Medications  sodium phosphate (FLEET) 7-19 GM/118ML enema 1 enema (1 enema Rectal Given 03/14/22 0907)    ED Course/ Medical Decision Making/ A&P Clinical Course as of 03/14/22 1054  Fri Mar 14, 2022  0831 Bladder scan is 994.  Foley catheter ordered [JK]  0951 Urinalysis without infection [JK]    Clinical Course User Index [JK] Dorie Rank, MD                             Medical Decision Making Problems Addressed: Constipation, unspecified constipation type: acute illness or injury that  poses a threat to life or bodily functions Fecal impaction [facility]): acute illness or injury that poses a threat to life or bodily functions Urinary retention: acute illness or injury that poses a threat to life or bodily functions  Amount and/or Complexity of Data Reviewed Labs: ordered.    Details: urinalysis without signs of infection  Risk OTC drugs.   Patient presented to the ED for evaluation of urinary retention and constipation.  Patient's bladder scan showed volume of 994 mL.  Foley catheter was placed and patient had significant urine output.  Patient continued to feel a sense of constipation where she was feeling much better.  Patient was ministered an enema and subsequently has had large volume of stool output.  Patient is feeling much better.  I suspect her urinary retention is related to the fecal impaction.  Will have her start stool softeners.  Will have her keep the catheter in place for a few days and follow-up with either urology or her OB/GYN.  Patient will contact her OB/GYN doctor to see if they are comfortable following up on her urinary retention.  Patient is [redacted] weeks pregnant but no signs of any acute complications with her pregnancy.  She has not had any vaginal bleeding.  She has not noticed any discharge.        Final Clinical Impression(s) / ED Diagnoses Final diagnoses:  Urinary retention  Fecal impaction (HCC)  Constipation, unspecified constipation type    Rx / DC Orders ED Discharge Orders          Ordered    polyethylene glycol (MIRALAX) 17 g packet  Daily        03/14/22 1052              Dorie Rank, MD 03/14/22 1055

## 2022-03-14 NOTE — ED Triage Notes (Signed)
Pt arrived via EMS for urinary retention. Pain 10/10. She is [redacted] weeks pregnant.  BP 126/64  HR 84 18 RR

## 2022-03-17 DIAGNOSIS — Z3A12 12 weeks gestation of pregnancy: Secondary | ICD-10-CM | POA: Diagnosis not present

## 2022-03-17 DIAGNOSIS — O09521 Supervision of elderly multigravida, first trimester: Secondary | ICD-10-CM | POA: Diagnosis not present

## 2022-04-11 DIAGNOSIS — Z361 Encounter for antenatal screening for raised alphafetoprotein level: Secondary | ICD-10-CM | POA: Diagnosis not present

## 2022-05-01 DIAGNOSIS — Z3A19 19 weeks gestation of pregnancy: Secondary | ICD-10-CM | POA: Diagnosis not present

## 2022-05-01 DIAGNOSIS — O09522 Supervision of elderly multigravida, second trimester: Secondary | ICD-10-CM | POA: Diagnosis not present

## 2022-07-02 DIAGNOSIS — M25551 Pain in right hip: Secondary | ICD-10-CM | POA: Diagnosis not present

## 2022-07-02 DIAGNOSIS — M5386 Other specified dorsopathies, lumbar region: Secondary | ICD-10-CM | POA: Diagnosis not present

## 2022-07-02 DIAGNOSIS — O24415 Gestational diabetes mellitus in pregnancy, controlled by oral hypoglycemic drugs: Secondary | ICD-10-CM | POA: Diagnosis not present

## 2022-07-02 DIAGNOSIS — Z3689 Encounter for other specified antenatal screening: Secondary | ICD-10-CM | POA: Diagnosis not present

## 2022-07-02 DIAGNOSIS — M9903 Segmental and somatic dysfunction of lumbar region: Secondary | ICD-10-CM | POA: Diagnosis not present

## 2022-07-02 DIAGNOSIS — Z3A28 28 weeks gestation of pregnancy: Secondary | ICD-10-CM | POA: Diagnosis not present

## 2022-07-02 DIAGNOSIS — M9905 Segmental and somatic dysfunction of pelvic region: Secondary | ICD-10-CM | POA: Diagnosis not present

## 2022-07-15 DIAGNOSIS — O24414 Gestational diabetes mellitus in pregnancy, insulin controlled: Secondary | ICD-10-CM | POA: Diagnosis not present

## 2022-07-15 DIAGNOSIS — Z3A30 30 weeks gestation of pregnancy: Secondary | ICD-10-CM | POA: Diagnosis not present

## 2022-07-29 DIAGNOSIS — Z3A32 32 weeks gestation of pregnancy: Secondary | ICD-10-CM | POA: Diagnosis not present

## 2022-07-29 DIAGNOSIS — O24415 Gestational diabetes mellitus in pregnancy, controlled by oral hypoglycemic drugs: Secondary | ICD-10-CM | POA: Diagnosis not present

## 2022-08-07 DIAGNOSIS — O403XX0 Polyhydramnios, third trimester, not applicable or unspecified: Secondary | ICD-10-CM | POA: Diagnosis not present

## 2022-08-07 DIAGNOSIS — Z3A33 33 weeks gestation of pregnancy: Secondary | ICD-10-CM | POA: Diagnosis not present

## 2022-08-14 DIAGNOSIS — Z3A34 34 weeks gestation of pregnancy: Secondary | ICD-10-CM | POA: Diagnosis not present

## 2022-08-14 DIAGNOSIS — O24414 Gestational diabetes mellitus in pregnancy, insulin controlled: Secondary | ICD-10-CM | POA: Diagnosis not present

## 2022-08-22 DIAGNOSIS — Z3A35 35 weeks gestation of pregnancy: Secondary | ICD-10-CM | POA: Diagnosis not present

## 2022-08-22 DIAGNOSIS — O09523 Supervision of elderly multigravida, third trimester: Secondary | ICD-10-CM | POA: Diagnosis not present

## 2022-08-22 DIAGNOSIS — O403XX0 Polyhydramnios, third trimester, not applicable or unspecified: Secondary | ICD-10-CM | POA: Diagnosis not present

## 2022-08-22 DIAGNOSIS — Z3685 Encounter for antenatal screening for Streptococcus B: Secondary | ICD-10-CM | POA: Diagnosis not present

## 2022-08-22 LAB — OB RESULTS CONSOLE GBS: GBS: NEGATIVE

## 2022-08-26 DIAGNOSIS — O09523 Supervision of elderly multigravida, third trimester: Secondary | ICD-10-CM | POA: Diagnosis not present

## 2022-08-26 DIAGNOSIS — O24415 Gestational diabetes mellitus in pregnancy, controlled by oral hypoglycemic drugs: Secondary | ICD-10-CM | POA: Diagnosis not present

## 2022-08-26 DIAGNOSIS — Z3A36 36 weeks gestation of pregnancy: Secondary | ICD-10-CM | POA: Diagnosis not present

## 2022-08-29 DIAGNOSIS — M5489 Other dorsalgia: Secondary | ICD-10-CM | POA: Diagnosis not present

## 2022-09-04 DIAGNOSIS — O24415 Gestational diabetes mellitus in pregnancy, controlled by oral hypoglycemic drugs: Secondary | ICD-10-CM | POA: Diagnosis not present

## 2022-09-04 DIAGNOSIS — Z3A37 37 weeks gestation of pregnancy: Secondary | ICD-10-CM | POA: Diagnosis not present

## 2022-09-09 DIAGNOSIS — O24415 Gestational diabetes mellitus in pregnancy, controlled by oral hypoglycemic drugs: Secondary | ICD-10-CM | POA: Diagnosis not present

## 2022-09-09 DIAGNOSIS — Z3A39 39 weeks gestation of pregnancy: Secondary | ICD-10-CM | POA: Diagnosis not present

## 2022-09-12 ENCOUNTER — Other Ambulatory Visit: Payer: Self-pay | Admitting: Obstetrics & Gynecology

## 2022-09-16 ENCOUNTER — Other Ambulatory Visit: Payer: Self-pay | Admitting: Obstetrics & Gynecology

## 2022-09-16 ENCOUNTER — Encounter (HOSPITAL_COMMUNITY): Payer: Self-pay | Admitting: *Deleted

## 2022-09-16 ENCOUNTER — Telehealth (HOSPITAL_COMMUNITY): Payer: Self-pay | Admitting: *Deleted

## 2022-09-16 NOTE — Telephone Encounter (Signed)
Preadmission screen  

## 2022-09-17 ENCOUNTER — Telehealth (HOSPITAL_COMMUNITY): Payer: Self-pay | Admitting: *Deleted

## 2022-09-17 ENCOUNTER — Encounter (HOSPITAL_COMMUNITY): Payer: Self-pay | Admitting: *Deleted

## 2022-09-17 NOTE — Telephone Encounter (Signed)
Preadmission screen  

## 2022-09-18 ENCOUNTER — Inpatient Hospital Stay (HOSPITAL_COMMUNITY): Payer: BC Managed Care – PPO | Admitting: Anesthesiology

## 2022-09-18 ENCOUNTER — Encounter (HOSPITAL_COMMUNITY): Payer: Self-pay | Admitting: Obstetrics & Gynecology

## 2022-09-18 ENCOUNTER — Inpatient Hospital Stay (HOSPITAL_COMMUNITY): Payer: BC Managed Care – PPO

## 2022-09-18 ENCOUNTER — Inpatient Hospital Stay (HOSPITAL_COMMUNITY)
Admission: AD | Admit: 2022-09-18 | Discharge: 2022-09-20 | DRG: 807 | Disposition: A | Discharge status: Home / Self Care | Payer: BC Managed Care – PPO | Attending: Obstetrics & Gynecology | Admitting: Obstetrics & Gynecology

## 2022-09-18 DIAGNOSIS — Z3490 Encounter for supervision of normal pregnancy, unspecified, unspecified trimester: Secondary | ICD-10-CM | POA: Diagnosis present

## 2022-09-18 DIAGNOSIS — O403XX0 Polyhydramnios, third trimester, not applicable or unspecified: Secondary | ICD-10-CM | POA: Diagnosis present

## 2022-09-18 DIAGNOSIS — Z0542 Observation and evaluation of newborn for suspected metabolic condition ruled out: Secondary | ICD-10-CM | POA: Diagnosis not present

## 2022-09-18 DIAGNOSIS — Z23 Encounter for immunization: Secondary | ICD-10-CM

## 2022-09-18 DIAGNOSIS — O24420 Gestational diabetes mellitus in childbirth, diet controlled: Secondary | ICD-10-CM | POA: Diagnosis not present

## 2022-09-18 DIAGNOSIS — O24410 Gestational diabetes mellitus in pregnancy, diet controlled: Secondary | ICD-10-CM | POA: Diagnosis present

## 2022-09-18 DIAGNOSIS — D573 Sickle-cell trait: Secondary | ICD-10-CM | POA: Diagnosis not present

## 2022-09-18 DIAGNOSIS — O9902 Anemia complicating childbirth: Secondary | ICD-10-CM | POA: Diagnosis present

## 2022-09-18 DIAGNOSIS — O24424 Gestational diabetes mellitus in childbirth, insulin controlled: Secondary | ICD-10-CM | POA: Diagnosis not present

## 2022-09-18 DIAGNOSIS — O09523 Supervision of elderly multigravida, third trimester: Secondary | ICD-10-CM | POA: Diagnosis not present

## 2022-09-18 DIAGNOSIS — O900 Disruption of cesarean delivery wound: Secondary | ICD-10-CM | POA: Diagnosis not present

## 2022-09-18 DIAGNOSIS — Z3A39 39 weeks gestation of pregnancy: Secondary | ICD-10-CM | POA: Diagnosis not present

## 2022-09-18 LAB — CBC
HCT: 33.8 % — ABNORMAL LOW (ref 36.0–46.0)
Hemoglobin: 10.4 g/dL — ABNORMAL LOW (ref 12.0–15.0)
MCH: 22.7 pg — ABNORMAL LOW (ref 26.0–34.0)
MCHC: 30.8 g/dL (ref 30.0–36.0)
MCV: 73.8 fL — ABNORMAL LOW (ref 80.0–100.0)
Platelets: 327 10*3/uL (ref 150–400)
RBC: 4.58 MIL/uL (ref 3.87–5.11)
RDW: 18.2 % — ABNORMAL HIGH (ref 11.5–15.5)
WBC: 5.2 10*3/uL (ref 4.0–10.5)
nRBC: 0 % (ref 0.0–0.2)

## 2022-09-18 LAB — RPR: RPR Ser Ql: NONREACTIVE

## 2022-09-18 LAB — TYPE AND SCREEN
ABO/RH(D): O POS
Antibody Screen: NEGATIVE

## 2022-09-18 LAB — GLUCOSE, CAPILLARY: Glucose-Capillary: 100 mg/dL — ABNORMAL HIGH (ref 70–99)

## 2022-09-18 MED ORDER — FENTANYL-BUPIVACAINE-NACL 0.5-0.125-0.9 MG/250ML-% EP SOLN
12.0000 mL/h | EPIDURAL | Status: DC | PRN
  Administered 2022-09-18: 12 mL/h via EPIDURAL
  Filled 2022-09-18: qty 250

## 2022-09-18 MED ORDER — EPHEDRINE 5 MG/ML INJ: 10.0000 mg | INTRAVENOUS | Status: DC | PRN

## 2022-09-18 MED ORDER — LIDOCAINE HCL (PF) 1 % IJ SOLN
INTRAMUSCULAR | Status: DC | PRN
  Administered 2022-09-18: 10 mL via EPIDURAL

## 2022-09-18 MED ORDER — TERBUTALINE SULFATE 1 MG/ML IJ SOLN: 0.2500 mg | Freq: Once | INTRAMUSCULAR | Status: DC | PRN

## 2022-09-18 MED ORDER — OXYTOCIN-SODIUM CHLORIDE 30-0.9 UT/500ML-% IV SOLN
2.5000 [IU]/h | INTRAVENOUS | Status: DC
  Administered 2022-09-19: 2.5 [IU]/h via INTRAVENOUS

## 2022-09-18 MED ORDER — DIPHENHYDRAMINE HCL 50 MG/ML IJ SOLN
12.5000 mg | INTRAMUSCULAR | Status: DC | PRN
  Administered 2022-09-18: 12.5 mg via INTRAVENOUS
  Filled 2022-09-18: qty 1

## 2022-09-18 MED ORDER — OXYTOCIN-SODIUM CHLORIDE 30-0.9 UT/500ML-% IV SOLN
1.0000 m[IU]/min | INTRAVENOUS | Status: DC
  Administered 2022-09-18: 2 m[IU]/min via INTRAVENOUS
  Filled 2022-09-18: qty 500

## 2022-09-18 MED ORDER — LACTATED RINGERS IV SOLN
500.0000 mL | Freq: Once | INTRAVENOUS | Status: AC
  Administered 2022-09-18: 500 mL via INTRAVENOUS

## 2022-09-18 MED ORDER — OXYTOCIN BOLUS FROM INFUSION
333.0000 mL | Freq: Once | INTRAVENOUS | Status: AC
  Administered 2022-09-19: 333 mL via INTRAVENOUS

## 2022-09-18 MED ORDER — MISOPROSTOL 25 MCG QUARTER TABLET
25.0000 ug | ORAL_TABLET | Freq: Once | ORAL | Status: AC
  Administered 2022-09-18: 25 ug via VAGINAL
  Filled 2022-09-18: qty 1

## 2022-09-18 MED ORDER — PHENYLEPHRINE 80 MCG/ML (10ML) SYRINGE FOR IV PUSH (FOR BLOOD PRESSURE SUPPORT): 80.0000 ug | PREFILLED_SYRINGE | INTRAVENOUS | Status: DC | PRN

## 2022-09-18 MED ORDER — LACTATED RINGERS IV SOLN: 500.0000 mL | INTRAVENOUS | Status: DC | PRN

## 2022-09-18 MED ORDER — ONDANSETRON HCL 4 MG/2ML IJ SOLN
4.0000 mg | Freq: Four times a day (QID) | INTRAMUSCULAR | Status: DC | PRN
  Administered 2022-09-19: 4 mg via INTRAVENOUS
  Filled 2022-09-18: qty 2

## 2022-09-18 MED ORDER — SOD CITRATE-CITRIC ACID 500-334 MG/5ML PO SOLN: 30.0000 mL | ORAL | Status: DC | PRN

## 2022-09-18 MED ORDER — LIDOCAINE HCL (PF) 1 % IJ SOLN: 30.0000 mL | INTRAMUSCULAR | Status: DC | PRN

## 2022-09-18 MED ORDER — MISOPROSTOL 25 MCG QUARTER TABLET
25.0000 ug | ORAL_TABLET | Freq: Once | ORAL | Status: AC
  Administered 2022-09-18: 25 ug via BUCCAL
  Filled 2022-09-18: qty 1

## 2022-09-18 MED ORDER — ACETAMINOPHEN 325 MG PO TABS: 650.0000 mg | ORAL_TABLET | ORAL | Status: DC | PRN

## 2022-09-18 MED ORDER — LACTATED RINGERS IV SOLN: INTRAVENOUS | Status: DC

## 2022-09-18 NOTE — Progress Notes (Addendum)
FHT cat I, variability currently minimal but moderate all day + accels and no decels.  UCs spacing out with coupling Cx exam unchanged per RN Advised to start pitocin at low dose, RN informs pt declines, wants to continue position changes and will talk w/ her husband  Will reassess at 10 pm, rn will start at 10 pm and call me if pt declines again  Exaggerated Sims to aid flexion and descent   CBGs q 6 hrs ok

## 2022-09-18 NOTE — H&P (Signed)
OB ADMISSION/ HISTORY & PHYSICAL:  Admission Date: 09/18/2022  7:37 AM  Admit Diagnosis: Encounter for induction of labor [Z34.90]    Treneka Tolefree is a 42 y.o. female presenting for IOL at term, AMA 42 yo, GDM A1, BMI > 30. Patient sitting in bed, spouse Jomarie Longs present and supportive. Feeling some cramping after first dose Cytotec this AM.  Expecting BG Kyung Rudd. Plans epidural in active labor.   Prenatal History: G2P1001   EDC : 09/23/2022, by Other Basis  Prenatal care at [facility] since 1st Trim. Primary Dr. Juliene Pina   Prenatal course complicated by: GDM A1 - early DM screen not tolerated, high fastings, was started on metformin at 19 wks, then insulin 23 weeks. Off all meds and only diet controlled since 28 wks.  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 AGA Growth sono 36 wks AGA EFW 6'6" 58%, AC 65%   Medical / Surgical History :  Past medical history:  Past Medical History:  Diagnosis Date   Asthma    BV (bacterial vaginosis)    Gestational diabetes    Hirsutism    PCOS (polycystic ovarian syndrome)      Past surgical history: History reviewed. No pertinent surgical history.   Family History:  Family History  Problem Relation Age of Onset   Hyperlipidemia Father    Hypertension Father      Social History:  reports that she has never smoked. She has never used smokeless tobacco. She reports that she does not drink alcohol and does not use drugs.  Allergies: Shellfish allergy    Current Medications at time of admission:  Medications Prior to Admission  Medication Sig Dispense Refill Last Dose   polyethylene glycol (MIRALAX) 17 g packet Take 17 g by mouth daily. 14 each 0      Review of Systems: ROS As noted above.  Physical Exam: Vital signs and nursing notes reviewed.  Patient Vitals for the past 24 hrs:  Height Weight  09/18/22 0835 5\' 5"  (1.651 m) 97.1 kg     General: AAO x 3, NAD Heart: RRR Lungs:CTAB Abdomen: Gravid, NT, Leopold's vertex Extremities: trace pedal edema Genitalia / VE: Dilation: Fingertip Effacement (%): 50 Station: Ballotable Presentation: Vertex Exam by:: m wilkins rnc   FHR: 135 BPM, moderate variability, + accels, no decels TOCO: Ctx q 2-4 min  Labs:   Pending T&S, CBC, RPR  Recent Labs    09/18/22 0811  WBC 5.2  HGB 10.4*  HCT 33.8*  PLT 327      Assessment/Plan:  42 y.o. G2P1001 at [redacted]w[redacted]d GDM A1 well controlled, AMA 42 yo, BMI > 30, hx GHTN on bASA  Fetal wellbeing - FHT category 1 EFW 7-8 lbs, AGA  Labor: IOL with cervical ripening, s/p one dose cytotec IF placement when able discussed Pitocin and AROM when favorable CBG now and q 4 hrs if stable  GBS neg Rubella imm Rh pos  Pain control: desires epidural in active labor Analgesia/anesthesia  PRN  Anticipated MOD: NSVB, pelvis proven 8 lbs  Plans to breastfeed POC discussed with patient and support team, all questions answered.  Dr Tildon Husky notified of admission / plan of care   Neta Mends CNM, MSN 09/18/2022, 10:04 AM

## 2022-09-18 NOTE — Progress Notes (Addendum)
Donna Morrison is a 42 y.o. G2P1001 at [redacted]w[redacted]d, here for IOL for A1GDM, Age >40. Other risk factors -polyhydramnios and BMI   Subjective: S/p epidural, no pain   Objective: BP 127/62   Pulse 69   Temp 97.7 F (36.5 C)   Ht 5\' 5"  (1.651 m)   Wt 97.1 kg   BMI 35.61 kg/m   FHT:  FHR: 120s bpm, variability: moderate,  accelerations:  Present,  decelerations:  Absent UC:   regular, every 3-4 minutes SVE:   Dilation: 5 Effacement (%): 50 Station: -3 Exam by:: Veronica Guerrant Controlled AROM, clear, head applied to cx after   Labs: Lab Results  Component Value Date   WBC 5.2 09/18/2022   HGB 10.4 (L) 09/18/2022   HCT 33.8 (L) 09/18/2022   MCV 73.8 (L) 09/18/2022   PLT 327 09/18/2022   Assessment / Plan: 42 yo G2P1001, 39.2 wks, IOL for A1GDM and AMA Pitocin per protocol, foley expelled, s/p AROM, clear  FHT cat I EFW 7.1/2- 8 lb, working towards SVD  GBS neg   Robley Fries, MD 09/18/2022, 3:43 PM

## 2022-09-18 NOTE — Anesthesia Preprocedure Evaluation (Signed)
Anesthesia Evaluation  Patient identified by MRN, date of birth, ID band Patient awake    Reviewed: Allergy & Precautions, H&P , NPO status , Patient's Chart, lab work & pertinent test results  Airway Mallampati: I       Dental no notable dental hx.    Pulmonary    Pulmonary exam normal        Cardiovascular negative cardio ROS Normal cardiovascular exam     Neuro/Psych negative neurological ROS  negative psych ROS   GI/Hepatic negative GI ROS, Neg liver ROS,,,  Endo/Other  negative endocrine ROSdiabetes, Gestational    Renal/GU negative Renal ROS  negative genitourinary   Musculoskeletal negative musculoskeletal ROS (+)    Abdominal  (+) + obese  Peds  Hematology negative hematology ROS (+)   Anesthesia Other Findings   Reproductive/Obstetrics (+) Pregnancy                             Anesthesia Physical Anesthesia Plan  ASA: 2  Anesthesia Plan:    Post-op Pain Management:    Induction:   PONV Risk Score and Plan:   Airway Management Planned:   Additional Equipment:   Intra-op Plan:   Post-operative Plan:   Informed Consent: I have reviewed the patients History and Physical, chart, labs and discussed the procedure including the risks, benefits and alternatives for the proposed anesthesia with the patient or authorized representative who has indicated his/her understanding and acceptance.       Plan Discussed with:   Anesthesia Plan Comments:        Anesthesia Quick Evaluation

## 2022-09-18 NOTE — Anesthesia Procedure Notes (Signed)
Epidural Patient location during procedure: OB Start time: 09/18/2022 1:20 PM End time: 09/18/2022 1:24 PM  Staffing Anesthesiologist: Leilani Able, MD  Preanesthetic Checklist Completed: patient identified, IV checked, site marked, risks and benefits discussed, surgical consent, monitors and equipment checked, pre-op evaluation and timeout performed  Epidural Patient position: sitting Prep: DuraPrep and site prepped and draped Patient monitoring: continuous pulse ox and blood pressure Approach: midline Location: L3-L4 Injection technique: LOR air  Needle:  Needle type: Tuohy  Needle gauge: 17 G Needle length: 9 cm and 9 Needle insertion depth: 6 cm Catheter type: closed end flexible Catheter size: 19 Gauge Catheter at skin depth: 11 cm Test dose: negative and Other  Assessment Events: blood not aspirated, no cerebrospinal fluid, injection not painful, no injection resistance, no paresthesia and negative IV test  Additional Notes Reason for block:procedure for pain

## 2022-09-18 NOTE — Progress Notes (Signed)
IOL at 39.2 wks for A1GDM, AMA. Pelvis proven to 8 lb  FHT cat I, variability moderate, + accels and occ early decels   UCs spacing out with coupling, pitocin at 2, dropped to 1 due to strong palpable UCs  Cx 5/ swelling noted/ 80%.-3, caput noted, cervix not well applied.  Exaggerated Sims to aid flexion and descent  EFW ?feels larger, 9 lbs?

## 2022-09-19 ENCOUNTER — Encounter (HOSPITAL_COMMUNITY): Payer: Self-pay | Admitting: Obstetrics & Gynecology

## 2022-09-19 ENCOUNTER — Other Ambulatory Visit: Payer: Self-pay

## 2022-09-19 LAB — GLUCOSE, CAPILLARY: Glucose-Capillary: 99 mg/dL (ref 70–99)

## 2022-09-19 LAB — CBC
HCT: 31.5 % — ABNORMAL LOW (ref 36.0–46.0)
Hemoglobin: 9.9 g/dL — ABNORMAL LOW (ref 12.0–15.0)
MCH: 22.7 pg — ABNORMAL LOW (ref 26.0–34.0)
MCHC: 31.4 g/dL (ref 30.0–36.0)
MCV: 72.2 fL — ABNORMAL LOW (ref 80.0–100.0)
Platelets: 279 10*3/uL (ref 150–400)
RBC: 4.36 MIL/uL (ref 3.87–5.11)
RDW: 18 % — ABNORMAL HIGH (ref 11.5–15.5)
WBC: 13.8 10*3/uL — ABNORMAL HIGH (ref 4.0–10.5)
nRBC: 0 % (ref 0.0–0.2)

## 2022-09-19 MED ORDER — ONDANSETRON HCL 4 MG PO TABS: 4.0000 mg | ORAL_TABLET | ORAL | Status: DC | PRN

## 2022-09-19 MED ORDER — TETANUS-DIPHTH-ACELL PERTUSSIS 5-2.5-18.5 LF-MCG/0.5 IM SUSY
0.5000 mL | PREFILLED_SYRINGE | Freq: Once | INTRAMUSCULAR | Status: AC
  Administered 2022-09-20: 0.5 mL via INTRAMUSCULAR
  Filled 2022-09-19: qty 0.5

## 2022-09-19 MED ORDER — COCONUT OIL OIL
1.0000 | TOPICAL_OIL | Status: DC | PRN
  Administered 2022-09-19: 1 via TOPICAL

## 2022-09-19 MED ORDER — PRENATAL MULTIVITAMIN CH
1.0000 | ORAL_TABLET | Freq: Every day | ORAL | Status: DC
  Administered 2022-09-19 – 2022-09-20 (×2): 1 via ORAL
  Filled 2022-09-19: qty 1

## 2022-09-19 MED ORDER — DIBUCAINE (PERIANAL) 1 % EX OINT: 1.0000 | TOPICAL_OINTMENT | CUTANEOUS | Status: DC | PRN

## 2022-09-19 MED ORDER — WITCH HAZEL-GLYCERIN EX PADS: 1.0000 | MEDICATED_PAD | CUTANEOUS | Status: DC | PRN

## 2022-09-19 MED ORDER — ZOLPIDEM TARTRATE 5 MG PO TABS: 5.0000 mg | ORAL_TABLET | Freq: Every evening | ORAL | Status: DC | PRN

## 2022-09-19 MED ORDER — SENNOSIDES-DOCUSATE SODIUM 8.6-50 MG PO TABS
2.0000 | ORAL_TABLET | Freq: Every day | ORAL | Status: DC
  Administered 2022-09-20: 2 via ORAL

## 2022-09-19 MED ORDER — DIPHENHYDRAMINE HCL 25 MG PO CAPS: 25.0000 mg | ORAL_CAPSULE | Freq: Four times a day (QID) | ORAL | Status: DC | PRN

## 2022-09-19 MED ORDER — ONDANSETRON HCL 4 MG/2ML IJ SOLN: 4.0000 mg | INTRAMUSCULAR | Status: DC | PRN

## 2022-09-19 MED ORDER — ACETAMINOPHEN 325 MG PO TABS
650.0000 mg | ORAL_TABLET | ORAL | Status: DC | PRN
  Administered 2022-09-19: 650 mg via ORAL
  Filled 2022-09-19: qty 2

## 2022-09-19 MED ORDER — SIMETHICONE 80 MG PO CHEW: 80.0000 mg | CHEWABLE_TABLET | ORAL | Status: DC | PRN

## 2022-09-19 MED ORDER — BENZOCAINE-MENTHOL 20-0.5 % EX AERO
1.0000 | INHALATION_SPRAY | CUTANEOUS | Status: DC | PRN
  Administered 2022-09-19: 1 via TOPICAL
  Filled 2022-09-19: qty 56

## 2022-09-19 MED ORDER — IBUPROFEN 600 MG PO TABS
600.0000 mg | ORAL_TABLET | Freq: Four times a day (QID) | ORAL | Status: DC
  Administered 2022-09-19 – 2022-09-20 (×2): 600 mg via ORAL
  Filled 2022-09-19 (×4): qty 1

## 2022-09-19 NOTE — Progress Notes (Signed)
Mother eating breakfast, asked to call RN when finished for assessment.

## 2022-09-19 NOTE — Progress Notes (Signed)
Post Partum Day 0  SVD, Girl  IOL for A1GDM   Subjective: no complaints, up ad lib, voiding, tolerating PO, and + flatus  Objective: Blood pressure 122/69, pulse 75, temperature 97.8 F (36.6 C), temperature source Axillary, resp. rate 18, height 5\' 5"  (1.651 m), weight 97.1 kg, SpO2 99%, unknown if currently breastfeeding.  Physical Exam:  General: cooperative Lochia: appropriate CV RRR Lungs CTA  Uterine Fundus: firm Incision: healing well DVT Evaluation: No evidence of DVT seen on physical exam. Negative Homan's sign.     Latest Ref Rng & Units 09/19/2022    5:54 AM 09/18/2022    8:11 AM 02/02/2021    7:59 PM  CBC  WBC 4.0 - 10.5 K/uL 13.8  5.2    Hemoglobin 12.0 - 15.0 g/dL 9.9  40.9  81.1   Hematocrit 36.0 - 46.0 % 31.5  33.8  37.0   Platelets 150 - 400 K/uL 279  327     O+ Rub Imm    Assessment/Plan: PPD #0, SVD, 1st degr perineal tear. Doing well . Baby Girl  LC support, routine PP care A1GDM- CBG nl. No testing now, test at home for 1 wk before 6 wk PP check  H/o GHTN last preg, none this time, watch BPs Anticipate Dc tomorrow   LOS: 1 day   Robley Fries, MD 09/19/2022, 10:46 AM

## 2022-09-19 NOTE — Lactation Note (Signed)
This note was copied from a baby's chart. Lactation Consultation Note  Patient Name: Donna Morrison Date: 09/19/2022 Age:41 hours Reason for consult: Follow-up assessment;Mother's request;Difficult latch;1st time breastfeeding;Term;Nipple pain/trauma;Breastfeeding assistance  P2- MOB states that infant wants to latch to breast, but needs the nipple shield to get going. MOB stated that her nipples are sensitive. With MOB's permission, LC latched infant to MOB's breast multiple times with the nipple shield. Infant had a strong rhythmic suck, but MOB requested for infant to unlatch.  LC, MOB and FOB attempted to latch infant in multiple positions on both breasts for 20 minutes to see what was most comfortable for MOB. After 20 minutes of attempting to feed, LC recommended stopping there and supplementing infant for the rest of the feed.   LC provided MOB with coconut oil to soothe her nipples. LC encouraged mother to call lactation team for further assistance.   Maternal Data Has patient been taught Hand Expression?: Yes Does the patient have breastfeeding experience prior to this delivery?: No  Feeding Mother's Current Feeding Choice: Breast Milk and Formula  LATCH Score Latch: Repeated attempts needed to sustain latch, nipple held in mouth throughout feeding, stimulation needed to elicit sucking reflex.  Audible Swallowing: None  Type of Nipple: Flat  Comfort (Breast/Nipple): Engorged, cracked, bleeding, large blisters, severe discomfort  Hold (Positioning): Full assist, staff holds infant at breast  LATCH Score: 2   Lactation Tools Discussed/Used Tools: Pump;Coconut oil;Nipple Shields Nipple shield size: 20 Breast pump type: Double-Electric Breast Pump Pump Education: Setup, frequency, and cleaning;Milk Storage Reason for Pumping: Mother's request Pumping frequency: 15-20 min every 2-3 hrs  Interventions Interventions: Breast feeding basics reviewed;Assisted  with latch;Skin to skin;Hand express;Breast compression;Adjust position;Support pillows;Position options;Expressed milk;Coconut oil;DEBP;Education;Pace feeding  Discharge Pump: DEBP  Consult Status Consult Status: Follow-up Date: 09/20/22 Follow-up type: In-patient    Dema Severin IBCLC, BS 09/19/2022, 7:12 PM

## 2022-09-19 NOTE — Plan of Care (Signed)

## 2022-09-20 NOTE — Discharge Summary (Signed)
Postpartum Discharge Summary  Date of Service updated     Patient Name: Donna Morrison DOB: [birth year] MRN: [id number]  Date of admission: 09/18/2022 Delivery date:09/19/2022 Delivering provider: MODY, VAISHALI Date of discharge: 09/20/2022  Admitting diagnosis: Encounter for induction of labor [Z34.90] SVD (spontaneous vaginal delivery) [O80] Intrauterine pregnancy: [redacted]w[redacted]d     Secondary diagnosis:  Principal Problem:   Postpartum care following vaginal delivery 8/30 Active Problems:   GDM, class A1   SVD (spontaneous vaginal delivery)   Encounter for induction of labor     Discharge diagnosis: Term Pregnancy Delivered                                              Complications: None  Hospital course: Induction of Labor With Vaginal Delivery   42 y.o. yo G2P2002 at [redacted]w[redacted]d was admitted to the hospital 09/18/2022 for induction of labor.  Indication for induction: A1 DM and AMA.  Patient had an labor course uncomplicated. Membrane Rupture Time/Date: 2:10 PM,09/18/2022  Delivery Method:Vaginal, Spontaneous Episiotomy: None Lacerations:  Vaginal;1st degree;Perineal;Labial Details of delivery can be found in separate delivery note.  Patient had a postpartum course complicated bynothing. Patient is discharged home 09/20/22.  Newborn Data: Birth date:09/19/2022 Birth time:2:14 AM Gender:Female Living status:Living Apgars:8 ,9  Weight:3200 g    Physical exam  Vitals:   09/19/22 1405 09/19/22 1739 09/19/22 2120 09/20/22 0547  BP: (!) 98/58 113/69 110/71 112/62  Pulse:  68 71 84  Resp: 16 18 16 17   Temp: 98.1 F (36.7 C) 98 F (36.7 C) 98.1 F (36.7 C) 98.5 F (36.9 C)  TempSrc: Oral Oral Oral Oral  SpO2:   99% 99%  Weight:      Height:       General: alert, cooperative, and no distress Lochia: appropriate Uterine Fundus: firm Incision: N/A DVT Evaluation: No evidence of DVT seen on physical exam. Labs: Lab Results  Component Value Date   WBC 13.8 (H)  09/19/2022   HGB 9.9 (L) 09/19/2022   HCT 31.5 (L) 09/19/2022   MCV 72.2 (L) 09/19/2022   PLT 279 09/19/2022      Latest Ref Rng & Units 02/02/2021    7:59 PM  CMP  Glucose 70 - 99 mg/dL 94   BUN 6 - 20 mg/dL 10   Creatinine 5.36 - 1.00 mg/dL 6.44   Sodium 034 - 742 mmol/L 140   Potassium 3.5 - 5.1 mmol/L 3.9   Chloride 98 - 111 mmol/L 106    Edinburgh Score:    09/19/2022    2:11 PM  Edinburgh Postnatal Depression Scale Screening Tool  I have been able to laugh and see the funny side of things. 0  I have looked forward with enjoyment to things. 0  I have blamed myself unnecessarily when things went wrong. 0  I have been anxious or worried for no good reason. 0  I have felt scared or panicky for no good reason. 0  Things have been getting on top of me. 0  I have been so unhappy that I have had difficulty sleeping. 0  I have felt sad or miserable. 0  I have been so unhappy that I have been crying. 0  The thought of harming myself has occurred to me. 0  Edinburgh Postnatal Depression Scale Total 0      After visit meds:  Allergies as of 09/20/2022       Reactions   Shellfish Allergy Swelling        Medication List     STOP taking these medications    polyethylene glycol 17 g packet Commonly known as: MiraLax         Discharge home in stable condition Infant Feeding: Breast Infant Disposition:home with mother Discharge instruction: per After Visit Summary and Postpartum booklet. Activity: Advance as tolerated. Pelvic rest for 6 weeks.  Diet: carb modified diet Anticipated Birth Control: Unsure Postpartum Appointment:6 weeks Future Appointments:No future appointments. Follow up Visit:  Follow-up Information     Shea Evans, MD Follow up.   Specialty: Obstetrics and Gynecology Contact information: [address] [place] [address] [phone]                     09/20/2022 Vick Frees, MD

## 2022-09-20 NOTE — Anesthesia Postprocedure Evaluation (Signed)
Anesthesia Post Note  Patient: Donna Morrison  Procedure(s) Performed: AN AD HOC LABOR EPIDURAL     Patient location during evaluation: Mother Baby Anesthesia Type: Epidural Level of consciousness: awake and alert and oriented Pain management: satisfactory to patient Vital Signs Assessment: post-procedure vital signs reviewed and stable Respiratory status: respiratory function stable Cardiovascular status: stable Postop Assessment: no headache, no backache, epidural receding, patient able to bend at knees, no signs of nausea or vomiting, adequate PO intake and able to ambulate Anesthetic complications: no   No notable events documented.  Last Vitals:  Vitals:   09/19/22 2120 09/20/22 0547  BP: 110/71 112/62  Pulse: 71 84  Resp: 16 17  Temp: 36.7 C 36.9 C  SpO2: 99% 99%    Last Pain:  Vitals:   09/20/22 0718  TempSrc:   PainSc: Asleep   Pain Goal:                   [name]

## 2022-09-20 NOTE — Progress Notes (Signed)
No c/o; nml lochia, voids w/o difficulty Breastfeeding Pain controlled  Patient Vitals for the past 24 hrs:  BP Temp Temp src Pulse Resp SpO2  09/20/22 0547 112/62 98.5 F (36.9 C) Oral 84 17 99 %  09/19/22 2120 110/71 98.1 F (36.7 C) Oral 71 16 99 %  09/19/22 1739 113/69 98 F (36.7 C) Oral 68 18 --  09/19/22 1405 (!) 98/58 98.1 F (36.7 C) Oral -- 16 --   A&ox3 Nml respirations Abd: soft,nt,nd; fundus firm and below umb LE: no edema, nt bilat     Latest Ref Rng & Units 09/19/2022    5:54 AM 09/18/2022    8:11 AM 02/02/2021    7:59 PM  CBC  WBC 4.0 - 10.5 K/uL 13.8  5.2    Hemoglobin 12.0 - 15.0 g/dL 9.9  18.8  41.6   Hematocrit 36.0 - 46.0 % 31.5  33.8  37.0   Platelets 150 - 400 K/uL 279  327     A/P: ppd 1 s/p svd Doing well, contin care; wants d/c home today Anemia of pregnancy - stable - plan iron pp q day Rh pos RI SS trait GDMA1 - nml bs, plan f/u outpt Girl, breastfeeding - plan lactation f/u today and understands availability as outpt

## 2022-10-21 ENCOUNTER — Telehealth (HOSPITAL_COMMUNITY): Payer: Self-pay | Admitting: *Deleted

## 2022-10-21 NOTE — Telephone Encounter (Signed)
10/21/2022  Name: Donna Morrison MRN: [id number] DOB: [birth year]  Reason for Call:  Transition of Care Hospital Discharge Call  Contact Status: Patient Contact Status: Message  Language assistant needed:          Follow-Up Questions:    Inocente Salles Postnatal Depression Scale:  In the Past 7 Days:    PHQ2-9 Depression Scale:     Discharge Follow-up:    Post-discharge interventions: NA  Elyshia Kumagai,RN  10/21/2022 1725

## 2022-11-24 DIAGNOSIS — N898 Other specified noninflammatory disorders of vagina: Secondary | ICD-10-CM | POA: Diagnosis not present

## 2022-11-24 DIAGNOSIS — Z8632 Personal history of gestational diabetes: Secondary | ICD-10-CM | POA: Diagnosis not present

## 2022-11-24 DIAGNOSIS — B3731 Acute candidiasis of vulva and vagina: Secondary | ICD-10-CM | POA: Diagnosis not present

## 2022-11-24 DIAGNOSIS — Z1331 Encounter for screening for depression: Secondary | ICD-10-CM | POA: Diagnosis not present

## 2023-05-28 DIAGNOSIS — Z1231 Encounter for screening mammogram for malignant neoplasm of breast: Secondary | ICD-10-CM | POA: Diagnosis not present

## 2023-05-28 DIAGNOSIS — Z13220 Encounter for screening for lipoid disorders: Secondary | ICD-10-CM | POA: Diagnosis not present

## 2023-05-28 DIAGNOSIS — Z0000 Encounter for general adult medical examination without abnormal findings: Secondary | ICD-10-CM | POA: Diagnosis not present

## 2023-05-28 DIAGNOSIS — Z1331 Encounter for screening for depression: Secondary | ICD-10-CM | POA: Diagnosis not present

## 2023-05-28 DIAGNOSIS — Z131 Encounter for screening for diabetes mellitus: Secondary | ICD-10-CM | POA: Diagnosis not present

## 2023-05-28 DIAGNOSIS — Z1329 Encounter for screening for other suspected endocrine disorder: Secondary | ICD-10-CM | POA: Diagnosis not present

## 2023-05-28 DIAGNOSIS — Z01419 Encounter for gynecological examination (general) (routine) without abnormal findings: Secondary | ICD-10-CM | POA: Diagnosis not present

## 2023-10-26 IMAGING — CT CT HEAD W/O CM
4 series · 16 of 47 positions shown, 18 images · non-contrast
Comparison: None.

CLINICAL DATA: Neuro deficit, acute, stroke suspected.



[Series 3: head wo · axial · 0.45mm/px · z∈[+1252,+1366]mm · 7 of 31 slices shown, 9 images]
[im 4/31  brain]
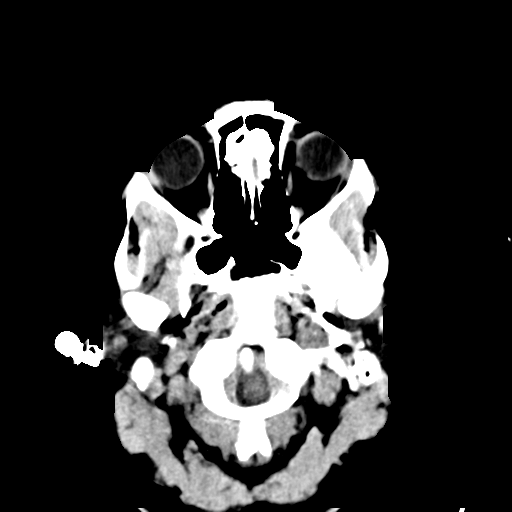
[im 4/31  bone]
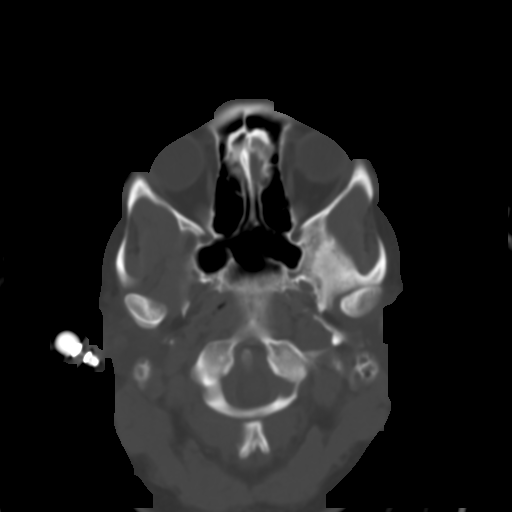
[im 8/31  brain]
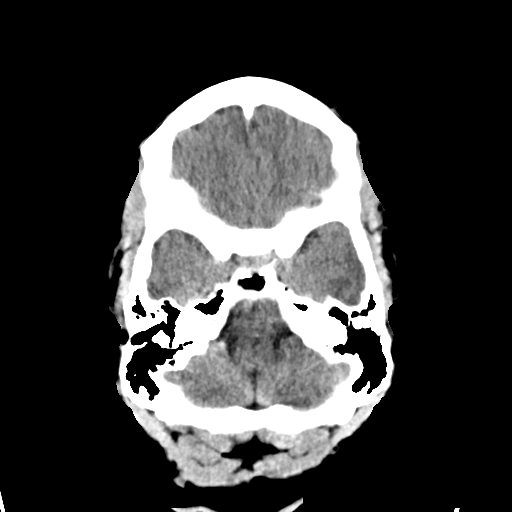
[im 12/31  brain]
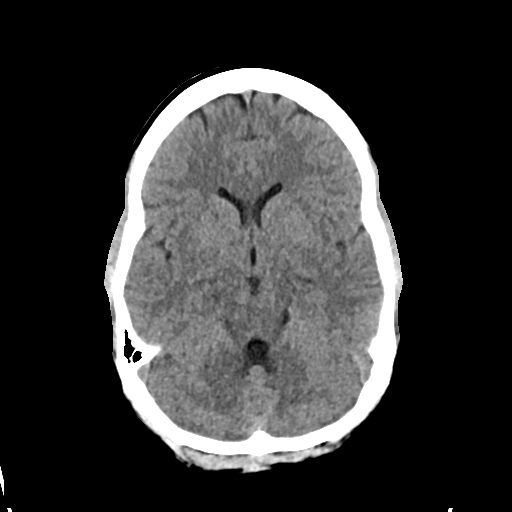
[im 16/31  brain]
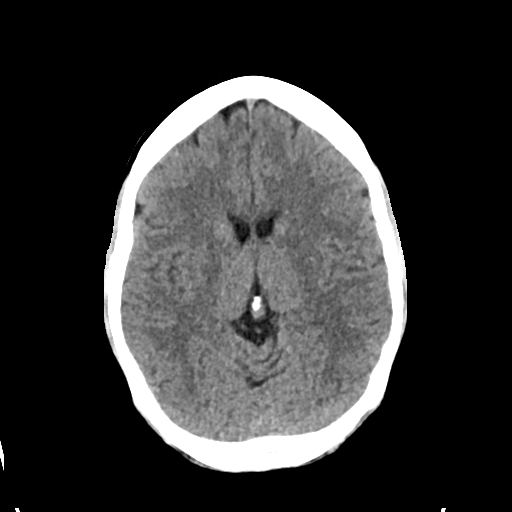
[im 19/31  brain]
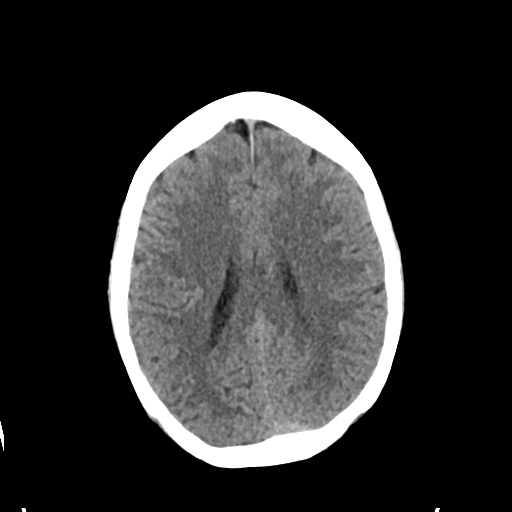
[im 19/31  bone]
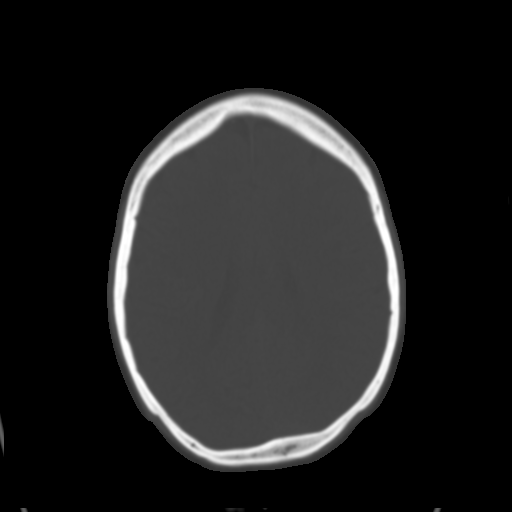
[im 23/31  brain]
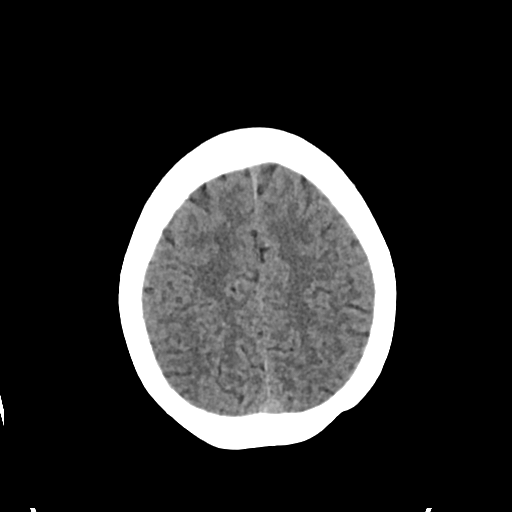
[im 27/31  brain]
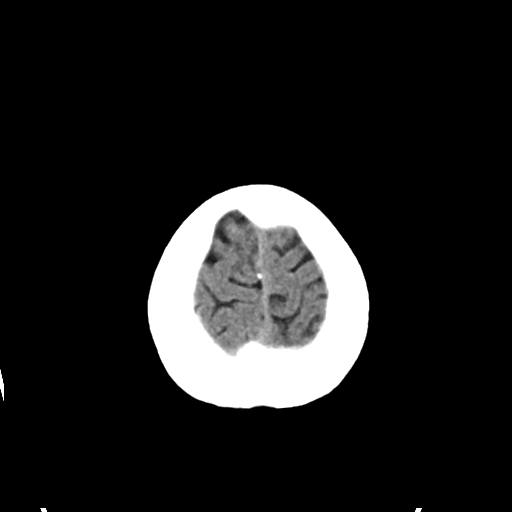

[Series 4: head bone · axial · 0.45mm/px · z∈[+1250,+1282]mm · 3 of 78 slices shown]
[im 8/78  bone]
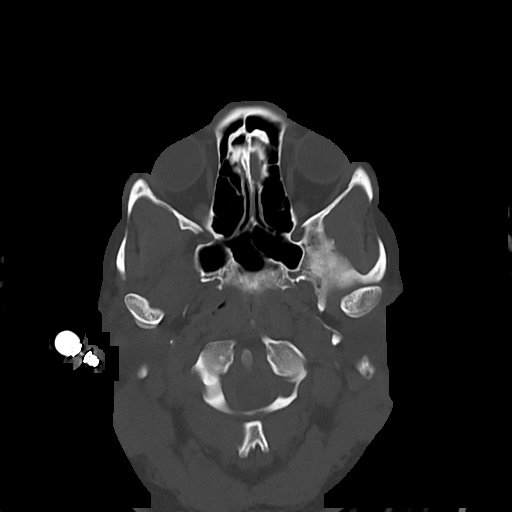
[im 16/78  bone]
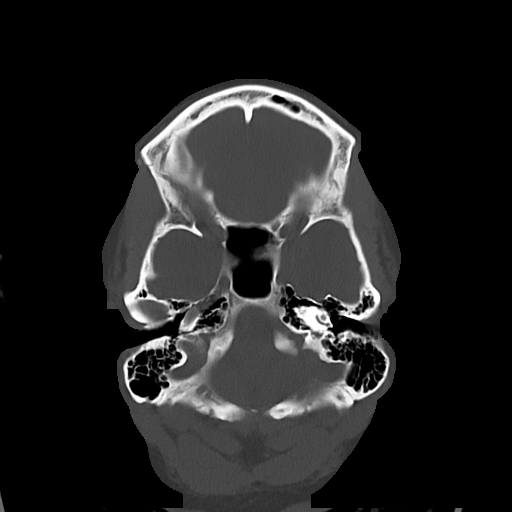
[im 24/78  bone]
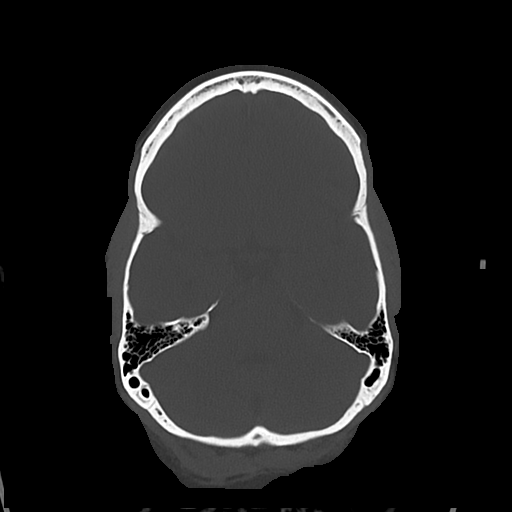

[Series 5: cor soft · coronal · 0.34mm/px · 3 of 72 slices shown]
[im 24/72  brain]
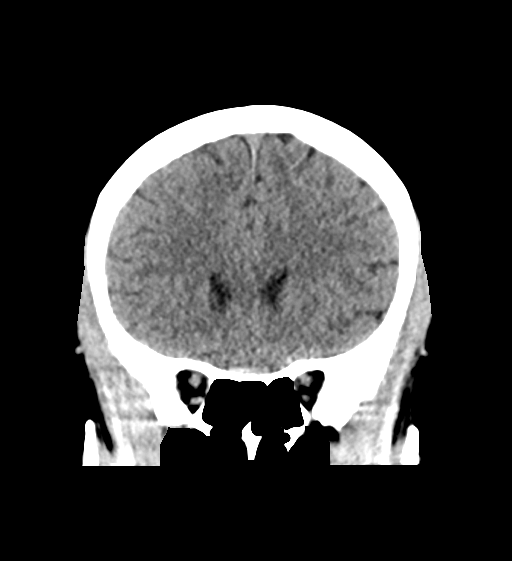
[im 32/72  brain]
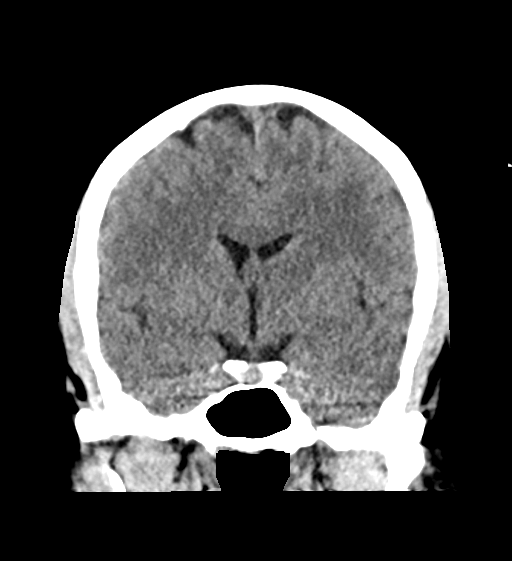
[im 40/72  brain]
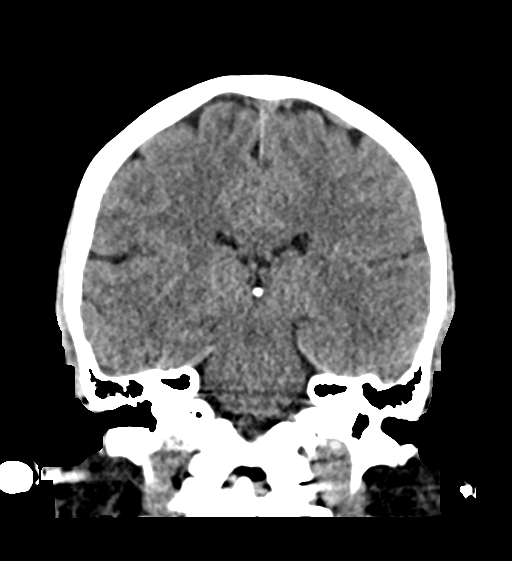

[Series 6: sag soft · sagittal · 0.35mm/px · 3 of 59 slices shown]
[im 20/59  brain]
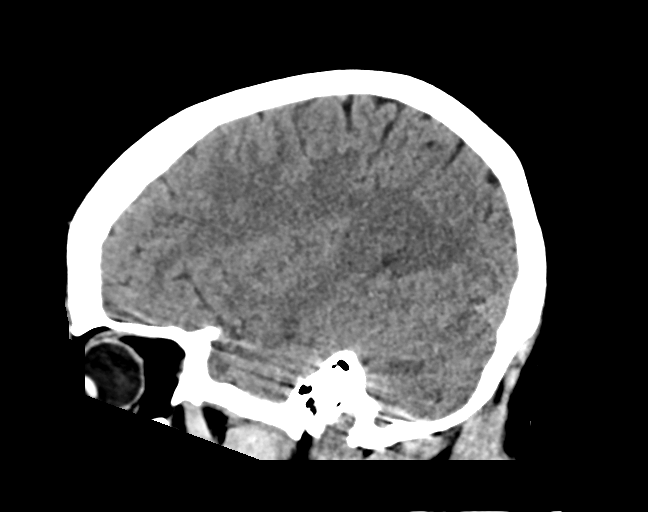
[im 30/59  brain]
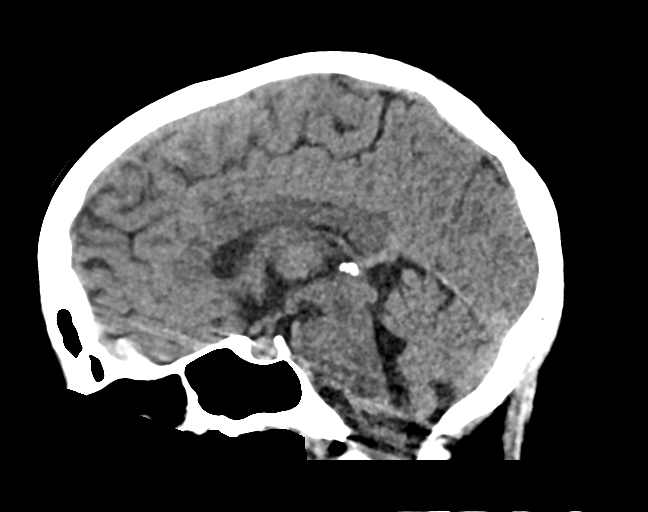
[im 39/59  brain]
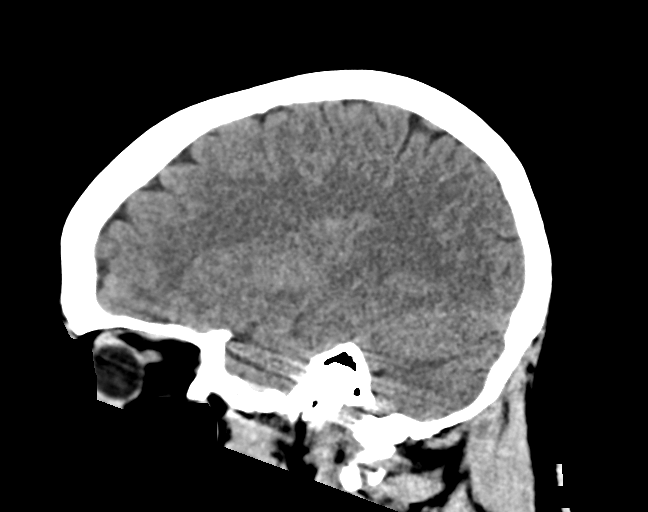

[16 of 47 positions shown; findings below may reference images not displayed]

FINDINGS: Brain: No evidence of acute infarction, hemorrhage, hydrocephalus,
extra-axial collection or mass lesion/mass effect.

Vascular: No hyperdense vessel or unexpected calcification.

Skull: Normal. Negative for fracture or focal lesion.

Sinuses/Orbits: No acute finding.

Other: None.
IMPRESSION: No acute intracranial CT findings.

## 2023-12-03 DIAGNOSIS — E282 Polycystic ovarian syndrome: Secondary | ICD-10-CM | POA: Diagnosis not present

## 2023-12-03 DIAGNOSIS — R7303 Prediabetes: Secondary | ICD-10-CM | POA: Diagnosis not present

## 2024-01-16 DIAGNOSIS — R079 Chest pain, unspecified: Secondary | ICD-10-CM | POA: Diagnosis not present

## 2024-01-16 DIAGNOSIS — R202 Paresthesia of skin: Secondary | ICD-10-CM | POA: Diagnosis not present

## 2024-01-16 DIAGNOSIS — Z8679 Personal history of other diseases of the circulatory system: Secondary | ICD-10-CM | POA: Diagnosis not present
